# Patient Record
Sex: Female | Born: 1935 | Race: Black or African American | Hispanic: No | Marital: Married | Smoking: Never smoker
Health system: Southern US, Community
[De-identification: ages and names within clinical notes are randomized; demographics above are authoritative.]

## PROBLEM LIST (undated history)

## (undated) DIAGNOSIS — E119 Type 2 diabetes mellitus without complications: Secondary | ICD-10-CM

## (undated) DIAGNOSIS — N189 Chronic kidney disease, unspecified: Secondary | ICD-10-CM

## (undated) DIAGNOSIS — G459 Transient cerebral ischemic attack, unspecified: Secondary | ICD-10-CM

## (undated) DIAGNOSIS — I10 Essential (primary) hypertension: Secondary | ICD-10-CM

## (undated) DIAGNOSIS — F0150 Vascular dementia without behavioral disturbance: Secondary | ICD-10-CM

## (undated) DIAGNOSIS — E538 Deficiency of other specified B group vitamins: Secondary | ICD-10-CM

## (undated) DIAGNOSIS — E7800 Pure hypercholesterolemia, unspecified: Secondary | ICD-10-CM

## (undated) HISTORY — PX: OTHER SURGICAL HISTORY: SHX169

---

## 2014-11-15 ENCOUNTER — Observation Stay: Payer: Self-pay | Admitting: Internal Medicine

## 2014-11-15 LAB — COMPREHENSIVE METABOLIC PANEL
ALK PHOS: 102 U/L
ALT: 30 U/L
ANION GAP: 7 (ref 7–16)
Albumin: 3.3 g/dL — ABNORMAL LOW (ref 3.4–5.0)
BUN: 21 mg/dL — AB (ref 7–18)
Bilirubin,Total: 0.4 mg/dL (ref 0.2–1.0)
Calcium, Total: 8.6 mg/dL (ref 8.5–10.1)
Chloride: 107 mmol/L (ref 98–107)
Co2: 26 mmol/L (ref 21–32)
Creatinine: 1.19 mg/dL (ref 0.60–1.30)
GLUCOSE: 119 mg/dL — AB (ref 65–99)
POTASSIUM: 4.9 mmol/L (ref 3.5–5.1)
SGOT(AST): 40 U/L — ABNORMAL HIGH (ref 15–37)
Sodium: 140 mmol/L (ref 136–145)
TOTAL PROTEIN: 7.3 g/dL (ref 6.4–8.2)

## 2014-11-15 LAB — CBC WITH DIFFERENTIAL/PLATELET
BASOS PCT: 1.1 %
Basophil #: 0.1 10*3/uL (ref 0.0–0.1)
Eosinophil #: 0.1 10*3/uL (ref 0.0–0.7)
Eosinophil %: 1.4 %
HCT: 36 % (ref 35.0–47.0)
HGB: 11.6 g/dL — ABNORMAL LOW (ref 12.0–16.0)
LYMPHS PCT: 39.9 %
Lymphocyte #: 3.6 10*3/uL (ref 1.0–3.6)
MCH: 29.2 pg (ref 26.0–34.0)
MCHC: 32.1 g/dL (ref 32.0–36.0)
MCV: 91 fL (ref 80–100)
MONO ABS: 0.9 x10 3/mm (ref 0.2–0.9)
Monocyte %: 10.5 %
NEUTROS ABS: 4.2 10*3/uL (ref 1.4–6.5)
Neutrophil %: 47.1 %
Platelet: 233 10*3/uL (ref 150–440)
RBC: 3.96 10*6/uL (ref 3.80–5.20)
RDW: 15.4 % — AB (ref 11.5–14.5)
WBC: 9 10*3/uL (ref 3.6–11.0)

## 2014-11-15 LAB — URINALYSIS, COMPLETE
BACTERIA: NONE SEEN
Bilirubin,UR: NEGATIVE
Blood: NEGATIVE
Glucose,UR: NEGATIVE mg/dL (ref 0–75)
KETONE: NEGATIVE
Nitrite: NEGATIVE
PH: 6 (ref 4.5–8.0)
Protein: NEGATIVE
RBC,UR: <1 /HPF (ref 0–5)
Specific Gravity: 1.009 (ref 1.003–1.030)
WBC UR: 3 /HPF (ref 0–5)

## 2014-11-15 LAB — HEMOGLOBIN A1C: Hemoglobin A1C: 6.4 % — ABNORMAL HIGH (ref 4.2–6.3)

## 2014-11-15 LAB — TROPONIN I: Troponin-I: <0.02 ng/mL

## 2014-11-15 LAB — MAGNESIUM: Magnesium: 1.8 mg/dL

## 2014-11-15 LAB — TSH: THYROID STIMULATING HORM: 5.29 u[IU]/mL — AB

## 2014-11-16 DIAGNOSIS — I340 Nonrheumatic mitral (valve) insufficiency: Secondary | ICD-10-CM

## 2014-11-16 LAB — COMPREHENSIVE METABOLIC PANEL
ALBUMIN: 3.2 g/dL — AB (ref 3.4–5.0)
Alkaline Phosphatase: 88 U/L
Anion Gap: 6 — ABNORMAL LOW (ref 7–16)
BILIRUBIN TOTAL: 0.4 mg/dL (ref 0.2–1.0)
BUN: 23 mg/dL — ABNORMAL HIGH (ref 7–18)
CO2: 27 mmol/L (ref 21–32)
Calcium, Total: 8.4 mg/dL — ABNORMAL LOW (ref 8.5–10.1)
Chloride: 108 mmol/L — ABNORMAL HIGH (ref 98–107)
Creatinine: 1.35 mg/dL — ABNORMAL HIGH (ref 0.60–1.30)
EGFR (African American): 48 — ABNORMAL LOW
EGFR (Non-African Amer.): 40 — ABNORMAL LOW
Glucose: 80 mg/dL (ref 65–99)
Osmolality: 284 (ref 275–301)
POTASSIUM: 4.1 mmol/L (ref 3.5–5.1)
SGOT(AST): 27 U/L (ref 15–37)
SGPT (ALT): 25 U/L
Sodium: 141 mmol/L (ref 136–145)
Total Protein: 6.7 g/dL (ref 6.4–8.2)

## 2014-11-16 LAB — CBC WITH DIFFERENTIAL/PLATELET
BASOS PCT: 0.8 %
Basophil #: 0.1 10*3/uL (ref 0.0–0.1)
EOS ABS: 0.1 10*3/uL (ref 0.0–0.7)
EOS PCT: 1.3 %
HCT: 34.1 % — ABNORMAL LOW (ref 35.0–47.0)
HGB: 11.1 g/dL — ABNORMAL LOW (ref 12.0–16.0)
LYMPHS ABS: 2.8 10*3/uL (ref 1.0–3.6)
Lymphocyte %: 35.6 %
MCH: 29.7 pg (ref 26.0–34.0)
MCHC: 32.6 g/dL (ref 32.0–36.0)
MCV: 91 fL (ref 80–100)
Monocyte #: 0.9 x10 3/mm (ref 0.2–0.9)
Monocyte %: 11.5 %
NEUTROS PCT: 50.8 %
Neutrophil #: 4 10*3/uL (ref 1.4–6.5)
Platelet: 214 10*3/uL (ref 150–440)
RBC: 3.74 10*6/uL — ABNORMAL LOW (ref 3.80–5.20)
RDW: 15 % — AB (ref 11.5–14.5)
WBC: 8 10*3/uL (ref 3.6–11.0)

## 2014-11-16 LAB — LIPID PANEL
CHOLESTEROL: 227 mg/dL — AB (ref 0–200)
HDL: 60 mg/dL (ref 40–60)
LDL CHOLESTEROL, CALC: 144 mg/dL — AB (ref 0–100)
Triglycerides: 115 mg/dL (ref 0–200)
VLDL Cholesterol, Calc: 23 mg/dL (ref 5–40)

## 2014-11-16 LAB — TROPONIN I: Troponin-I: <0.02 ng/mL

## 2015-04-20 NOTE — H&P (Signed)
PATIENT NAME:  Theresa Santos, STEHR MR#:  [id number] DATE OF BIRTH:  05/15/1931  DATE OF ADMISSION:  11/15/2014  PRIMARY CARE PHYSICIAN: Marwan T. Powers, MD from [place].  HISTORY OF PRESENT ILLNESS: The patient is an 81 year old African American female with history of dementia, diabetes mellitus, questionable hypertension, who presents to the hospital with complaints of excessive aphasia. According to the patient's daughter as well as the patient herself, today at around 4:00 p.m. she started having difficulty speaking. She was moaning and trying to get her word out but was not able to do so. It lasted approximately 15 minutes and then went away. There was no difficulty with vision, swallowing, numbness or weakness in her upper or lower extremities. She presented to the Emergency Room for further evaluation and was noted to have mildly elevated blood pressure and hospitalist services were contacted for admission.   PAST MEDICAL HISTORY: Significant for history of dementia, diabetes, questionable hypertension.   MEDICATIONS: Unknown; possible donepezil, unknown doses.   PAST SURGICAL HISTORY: None except for cataracts, kidney biopsy for unknown kidney problems.  ALLERGIES: None.   FAMILY HISTORY: Hypertension in the patient's family. Stroke in the patient's mother who also had hypertension. The patient's father died at age of 87 of MI. The patient has 5 sisters, 1 sister had pancreatic cancer as well as breast cancer and the other sister had breast cancer.   SOCIAL HISTORY: The patient is widowed 3 years ago in December. She is living with her daughter since a few months ago. No smoking or alcohol abuse. She used to work in Product/process development scientist.  REVIEW OF SYSTEMS:  CONSTITUTIONAL: Positive for weight loss, approximately 10 pounds since arrival to [place] from [place]. Also feeling sweaty and warm after her episode of expressive aphasia. Cataracts are removed. She has history also of glaucoma.  Denies any fevers, chills, fatigue, weakness, pains or weight gain.  EYES: Denies any blurry vision, double vision.  EARS, NOSE AND THROAT: Denies any tinnitus, allergies, epistaxis, sinus pain, dentures, difficulty swallowing.  RESPIRATORY: Denies any wheezes, asthma, COPD.  CARDIOVASCULAR: Denies chest pains, orthopnea, arrhythmias, palpitations or syncope.  GASTROINTESTINAL: Denies any nausea, vomiting, diarrhea or constipation.  GENITOURINARY: Denies any dysuria, hematuria, frequency, incontinence. ENDOCRINOLOGY: Denies any polydipsia, nocturia, thyroid problems, heat or cold intolerance or thirst.  HEMATOLOGIC: Denies anemia, easy bruising or bleeding, swollen glands. SKIN: Denies any acne, rashes, lesions or change in moles.  MUSCULOSKELETAL: Denies arthritis, cramps, swelling.  NEUROLOGIC: Denies numbness, epilepsy or tremor.  PSYCHIATRIC: Denies anxiety, insomnia, depression.   PHYSICAL EXAMINATION:  VITAL SIGNS: On arrival to the hospital, temperature 98.2, pulse was 83, respiration rate 16, blood pressure 177/85, saturation was 97% on room air.  GENERAL: This is a well-developed, well-nourished, thin African American female in no significant distress, lying on the stretcher.  HEENT: Her pupils are equal, reactive to light. Extraocular movements intact. No icterus or conjunctivitis. Has normal hearing. No pharyngeal erythema. Mucosa is moist. NECK: No masses. Supple, nontender. Thyroid is not enlarged. No adenopathy. No JVD or carotid bruits bilaterally. Full range of motion.  LUNGS: Clear to auscultation in all fields. No rales, rhonchi, diminished breath sounds or wheezing. No labored inspiration, increased effort, dullness to percussion or overt respiratory distress.  CARDIOVASCULAR: S1, S2 appreciated. Rythm was regular. PMI not lateralized. Chest is nontender to palpation. Pedal pulses 1+. No lower extremity edema, calf tenderness or cyanosis was noted. ABDOMEN: Soft, nontender.  Bowel sounds are present. No hepatosplenomegaly or masses were noted.  RECTAL:  Deferred.  MUSCLE STRENGTH: Able to move all extremities, however, the patient does have significant difficulty with lower extremities since she has quite obvious cramps in her calf muscles each time she tries to lift her legs and keep them up from the bed. Otherwise, 5/5 in the upper extremities. No cyanosis, degenerative joint disease or kyphosis is noted. Gait not tested. SKIN: Did not reveal any rashes, lesions, erythema, nodularity or induration. It was warm and dry to palpation.  LYMPHATIC: No adenopathy in the cervical region.  NEUROLOGICAL: Cranial nerves grossly intact. Sensory is intact. The patient does have some expressive aphasia. She intermittently looks to her daughter to help her to remember things. She is alert and oriented to person and place. She is cooperative. Memory is impaired. PSYCHIATRIC: No significant confusion, agitation or depression was noted.   EKG: Showed normal sinus rhythm at 82 beats per minute, normal axis, voltage criteria for LVH with T depressions in high lateral leads, poor R wave progression in V3, no acute ST-T changes were noted.   LABORATORY DATA: Glucose 119, BUN of 21, otherwise BMP was unremarkable. The patient has albumin level of 3.3, AST elevated at 40. The patient's troponin was less than 0.02. White blood cell count is normal at 9.0, hemoglobin 11.6, platelet count 233,000. Absolute neutrophil count was 4.2 which is normal. Urinalysis was remarkable for 3 white blood cells, less than 1 red blood cell.   RADIOLOGIC STUDIES: CT scan of head without contrast showed chronic atrophic and ischemic changes without acute abnormality   ASSESSMENT AND PLAN:  1.  Transient ischemic attack with expressive aphasia. Admit the patient to the medical floor, telemetry, starting her on aspirin therapy as well as Lipitor. Get lipid panel in the morning.  Get carotid ultrasound as well as  echocardiogram.  2.  Diabetes mellitus. Get hemoglobin A1c and continue diabetic diet.  3.  Hypertension. Start the patient on lisinopril.  4.  Elevated transaminases. We will follow, unclear etiology at this point.  5.  Dementia. Continue donepezil at lower dose.   TIME SPENT: Fifty minutes.   ____________________________ Katharina Caperima Sande Pickert, MD rv:TT D: 11/15/2014 20:08:12 ET T: 11/15/2014 20:34:22 ET JOB#: [id number]  cc: Katharina Caperima Daryus Sowash, MD, <Dictator> Marwan T. Powers, MD Katharina CaperIMA Shadiyah Wernli MD ELECTRONICALLY SIGNED 12/20/2014 20:26

## 2015-04-20 NOTE — Discharge Summary (Signed)
Dates of Admission and Diagnosis:  Date of Admission 15-Nov-2014   Date of Discharge 16-Nov-2014   Admitting Diagnosis transient ischemic attack   Final Diagnosis transient ischemic attack hypertension hyperlipidemia Diabetes    Chief Complaint/History of Present Illness an 81 year old African American female with history of dementia, diabetes mellitus, questionable hypertension, who presents to the hospital with complaints of excessive aphasia. According to the patient's daughter as well as the patient herself, today at around 4:00 p.m. she started having difficulty speaking. She was moaning and trying to get her word out but was not able to do so. It lasted approximately 15 minutes and then went away. There was no difficulty with vision, swallowing, numbness or weakness in her upper or lower extremities. She presented to the Emergency Room for further evaluation and was noted to have mildly elevated blood pressure and hospitalist services were contacted for admission.   Allergies:  No Known Allergies:   Pertinent Past History:  Pertinent Past History dementia, diabetes, questionable hypertension.   Hospital Course:  Hospital Course 1.  Transient ischemic attack with expressive aphasia.   CT head, Carotid doppler and Echo are non contributory.    Functionally pt is back to her baseline, very active and walks without any support.    Lipids are slightly high.    Start ASA- she said 6 yrs ago she had soem problem due to statin in her kidney and they had to do kidney biopsy.     So will not start Statin- she can discuss with her PMD and decide. 2.  Diabetes mellitus. hemoglobin A1c- 6.1 and continue diabetic diet.  3.  Hypertension. not on any meds at home- will give lisinopril. 4.  Elevated transaminases. We will follow, unclear etiology at this point.  5.  Dementia. Continue donepezil at lower dose.   Condition on Discharge Stable   DISCHARGE INSTRUCTIONS HOME MEDS:  Medication  Reconciliation: Patient's Home Medications at Discharge:     Medication Instructions  lisinopril 5 mg oral tablet  1 tab(s) orally once a day   aspirin 81 mg oral delayed release tablet  1 tab(s) orally once a day   aricept 10 mg oral tablet  1 tab(s) orally once a day (at bedtime)   fortamet 500 mg oral tablet, extended release  1 tab(s) orally once a day     Physician's Instructions:  Diet Low Sodium  Low Fat, Low Cholesterol  Carbohydrate Controlled (ADA) Diet   Dietary Supplements Glucerna   Dietary Supplements Frequency Two times per day   Activity Limitations As tolerated   Return to Work Not Applicable   Time frame for Follow Up Appointment 2-4 weeks  PMD   Other Comments Pt is advised to discuss about cholesterol medication with PMD.   TIME SPENT:  Total Time: Greater than 30 minutes   Electronic Signatures: Altamese DillingVachhani, Sianni Cloninger (MD)  (Signed [phone]-Nov-15 18:42)  Authored: ADMISSION DATE AND DIAGNOSIS, CHIEF COMPLAINT/HPI, Allergies, PERTINENT PAST HISTORY, HOSPITAL COURSE, DISCHARGE INSTRUCTIONS HOME MEDS, PATIENT INSTRUCTIONS, TIME SPENT   Last Updated: 24-Nov-15 18:42 by Altamese DillingVachhani, Ajmal Kathan (MD)

## 2016-11-11 ENCOUNTER — Inpatient Hospital Stay
Admission: EM | Admit: 2016-11-11 | Discharge: 2016-11-14 | DRG: 175 | Disposition: A | Discharge status: Skilled Nursing Facility | Payer: Medicare Other | Attending: Internal Medicine | Admitting: Internal Medicine

## 2016-11-11 ENCOUNTER — Emergency Department: Payer: Medicare Other

## 2016-11-11 ENCOUNTER — Encounter: Payer: Self-pay | Admitting: Medical Oncology

## 2016-11-11 DIAGNOSIS — E7800 Pure hypercholesterolemia, unspecified: Secondary | ICD-10-CM | POA: Diagnosis present

## 2016-11-11 DIAGNOSIS — J9601 Acute respiratory failure with hypoxia: Secondary | ICD-10-CM | POA: Diagnosis present

## 2016-11-11 DIAGNOSIS — E1122 Type 2 diabetes mellitus with diabetic chronic kidney disease: Secondary | ICD-10-CM | POA: Diagnosis present

## 2016-11-11 DIAGNOSIS — I248 Other forms of acute ischemic heart disease: Secondary | ICD-10-CM | POA: Diagnosis present

## 2016-11-11 DIAGNOSIS — I340 Nonrheumatic mitral (valve) insufficiency: Secondary | ICD-10-CM | POA: Diagnosis present

## 2016-11-11 DIAGNOSIS — N183 Chronic kidney disease, stage 3 unspecified: Secondary | ICD-10-CM

## 2016-11-11 DIAGNOSIS — F0150 Vascular dementia without behavioral disturbance: Secondary | ICD-10-CM | POA: Diagnosis present

## 2016-11-11 DIAGNOSIS — M2500 Hemarthrosis, unspecified joint: Secondary | ICD-10-CM

## 2016-11-11 DIAGNOSIS — M1712 Unilateral primary osteoarthritis, left knee: Secondary | ICD-10-CM | POA: Diagnosis present

## 2016-11-11 DIAGNOSIS — M79604 Pain in right leg: Secondary | ICD-10-CM

## 2016-11-11 DIAGNOSIS — D72829 Elevated white blood cell count, unspecified: Secondary | ICD-10-CM

## 2016-11-11 DIAGNOSIS — R55 Syncope and collapse: Secondary | ICD-10-CM

## 2016-11-11 DIAGNOSIS — I2699 Other pulmonary embolism without acute cor pulmonale: Secondary | ICD-10-CM | POA: Diagnosis not present

## 2016-11-11 DIAGNOSIS — R7989 Other specified abnormal findings of blood chemistry: Secondary | ICD-10-CM

## 2016-11-11 DIAGNOSIS — I130 Hypertensive heart and chronic kidney disease with heart failure and stage 1 through stage 4 chronic kidney disease, or unspecified chronic kidney disease: Secondary | ICD-10-CM | POA: Diagnosis present

## 2016-11-11 DIAGNOSIS — M6281 Muscle weakness (generalized): Secondary | ICD-10-CM

## 2016-11-11 DIAGNOSIS — E538 Deficiency of other specified B group vitamins: Secondary | ICD-10-CM | POA: Diagnosis present

## 2016-11-11 DIAGNOSIS — M25062 Hemarthrosis, left knee: Secondary | ICD-10-CM | POA: Diagnosis present

## 2016-11-11 DIAGNOSIS — I5020 Unspecified systolic (congestive) heart failure: Secondary | ICD-10-CM | POA: Diagnosis present

## 2016-11-11 DIAGNOSIS — E876 Hypokalemia: Secondary | ICD-10-CM | POA: Diagnosis present

## 2016-11-11 DIAGNOSIS — E872 Acidosis, unspecified: Secondary | ICD-10-CM

## 2016-11-11 DIAGNOSIS — R778 Other specified abnormalities of plasma proteins: Secondary | ICD-10-CM

## 2016-11-11 DIAGNOSIS — M25562 Pain in left knee: Secondary | ICD-10-CM

## 2016-11-11 DIAGNOSIS — I420 Dilated cardiomyopathy: Secondary | ICD-10-CM | POA: Diagnosis present

## 2016-11-11 DIAGNOSIS — W19XXXA Unspecified fall, initial encounter: Secondary | ICD-10-CM

## 2016-11-11 DIAGNOSIS — D696 Thrombocytopenia, unspecified: Secondary | ICD-10-CM | POA: Diagnosis present

## 2016-11-11 DIAGNOSIS — R0902 Hypoxemia: Secondary | ICD-10-CM

## 2016-11-11 DIAGNOSIS — M25462 Effusion, left knee: Secondary | ICD-10-CM

## 2016-11-11 DIAGNOSIS — I429 Cardiomyopathy, unspecified: Secondary | ICD-10-CM

## 2016-11-11 DIAGNOSIS — R52 Pain, unspecified: Secondary | ICD-10-CM

## 2016-11-11 DIAGNOSIS — Z8673 Personal history of transient ischemic attack (TIA), and cerebral infarction without residual deficits: Secondary | ICD-10-CM | POA: Diagnosis not present

## 2016-11-11 HISTORY — DX: Deficiency of other specified B group vitamins: E53.8

## 2016-11-11 HISTORY — DX: Chronic kidney disease, unspecified: N18.9

## 2016-11-11 HISTORY — DX: Vascular dementia, unspecified severity, without behavioral disturbance, psychotic disturbance, mood disturbance, and anxiety: F01.50

## 2016-11-11 HISTORY — DX: Essential (primary) hypertension: I10

## 2016-11-11 HISTORY — DX: Pure hypercholesterolemia, unspecified: E78.00

## 2016-11-11 HISTORY — DX: Transient cerebral ischemic attack, unspecified: G45.9

## 2016-11-11 HISTORY — DX: Type 2 diabetes mellitus without complications: E11.9

## 2016-11-11 LAB — COMPREHENSIVE METABOLIC PANEL
ALK PHOS: 88 U/L (ref 38–126)
ALT: 29 U/L (ref 14–54)
ANION GAP: 12 (ref 5–15)
AST: 37 U/L (ref 15–41)
Albumin: 3.5 g/dL (ref 3.5–5.0)
BUN: 20 mg/dL (ref 6–20)
CALCIUM: 8.2 mg/dL — AB (ref 8.9–10.3)
CHLORIDE: 111 mmol/L (ref 101–111)
CO2: 17 mmol/L — AB (ref 22–32)
CREATININE: 1.36 mg/dL — AB (ref 0.44–1.00)
GFR, EST AFRICAN AMERICAN: 40 mL/min — AB (ref >60)
GFR, EST NON AFRICAN AMERICAN: 35 mL/min — AB (ref >60)
Glucose, Bld: 235 mg/dL — ABNORMAL HIGH (ref 65–99)
Potassium: 3.4 mmol/L — ABNORMAL LOW (ref 3.5–5.1)
SODIUM: 140 mmol/L (ref 135–145)
Total Bilirubin: 0.8 mg/dL (ref 0.3–1.2)
Total Protein: 6.4 g/dL — ABNORMAL LOW (ref 6.5–8.1)

## 2016-11-11 LAB — CBC
HCT: 36.7 % (ref 35.0–47.0)
HEMOGLOBIN: 12.1 g/dL (ref 12.0–16.0)
MCH: 29.9 pg (ref 26.0–34.0)
MCHC: 33.1 g/dL (ref 32.0–36.0)
MCV: 90.3 fL (ref 80.0–100.0)
PLATELETS: 145 10*3/uL — AB (ref 150–440)
RBC: 4.06 MIL/uL (ref 3.80–5.20)
RDW: 14.8 % — ABNORMAL HIGH (ref 11.5–14.5)
WBC: 18.5 10*3/uL — AB (ref 3.6–11.0)

## 2016-11-11 LAB — URINALYSIS COMPLETE WITH MICROSCOPIC (ARMC ONLY)
Bacteria, UA: NONE SEEN
Bilirubin Urine: NEGATIVE
Glucose, UA: >500 mg/dL — AB
HGB URINE DIPSTICK: NEGATIVE
LEUKOCYTES UA: NEGATIVE
Nitrite: NEGATIVE
PH: 6 (ref 5.0–8.0)
PROTEIN: NEGATIVE mg/dL
SPECIFIC GRAVITY, URINE: 1.016 (ref 1.005–1.030)
Squamous Epithelial / LPF: NONE SEEN

## 2016-11-11 LAB — PROTIME-INR
INR: 0.99
Prothrombin Time: 13.1 seconds (ref 11.4–15.2)

## 2016-11-11 LAB — APTT: APTT: 24 s (ref 24–36)

## 2016-11-11 LAB — TROPONIN I
TROPONIN I: 0.17 ng/mL — AB (ref <0.03)
Troponin I: 0.74 ng/mL (ref <0.03)
Troponin I: 1.2 ng/mL (ref <0.03)

## 2016-11-11 LAB — HEPARIN LEVEL (UNFRACTIONATED): Heparin Unfractionated: 1.44 IU/mL — ABNORMAL HIGH (ref 0.30–0.70)

## 2016-11-11 LAB — GLUCOSE, CAPILLARY
GLUCOSE-CAPILLARY: 128 mg/dL — AB (ref 65–99)
Glucose-Capillary: 157 mg/dL — ABNORMAL HIGH (ref 65–99)

## 2016-11-11 LAB — MRSA PCR SCREENING: MRSA BY PCR: NEGATIVE

## 2016-11-11 MED ORDER — HEPARIN (PORCINE) IN NACL 100-0.45 UNIT/ML-% IJ SOLN
850.0000 [IU]/h | INTRAMUSCULAR | Status: DC
  Administered 2016-11-12 (×2): 850 [IU]/h via INTRAVENOUS
  Filled 2016-11-11 (×2): qty 250

## 2016-11-11 MED ORDER — SODIUM CHLORIDE 0.9 % IV SOLN
Freq: Once | INTRAVENOUS | Status: AC
  Administered 2016-11-11: 10:00:00 via INTRAVENOUS

## 2016-11-11 MED ORDER — HEPARIN SODIUM (PORCINE) 5000 UNIT/ML IJ SOLN
4000.0000 [IU] | Freq: Once | INTRAMUSCULAR | Status: AC
Start: 2016-11-11 — End: 2016-11-11
  Administered 2016-11-11: 4000 [IU] via INTRAVENOUS
  Filled 2016-11-11: qty 1

## 2016-11-11 MED ORDER — SODIUM CHLORIDE 0.9 % IV SOLN: 250.0000 mL | INTRAVENOUS | Status: DC | PRN

## 2016-11-11 MED ORDER — ACETAMINOPHEN 650 MG RE SUPP: 650.0000 mg | Freq: Four times a day (QID) | RECTAL | Status: DC | PRN

## 2016-11-11 MED ORDER — LISINOPRIL 5 MG PO TABS
5.0000 mg | ORAL_TABLET | Freq: Every day | ORAL | Status: DC
  Administered 2016-11-11 – 2016-11-14 (×4): 5 mg via ORAL
  Filled 2016-11-11 (×4): qty 1

## 2016-11-11 MED ORDER — ADULT MULTIVITAMIN W/MINERALS CH
1.0000 | ORAL_TABLET | Freq: Every day | ORAL | Status: DC
  Administered 2016-11-11 – 2016-11-14 (×4): 1 via ORAL
  Filled 2016-11-11 (×4): qty 1

## 2016-11-11 MED ORDER — SODIUM CHLORIDE 0.9% FLUSH: 3.0000 mL | INTRAVENOUS | Status: DC | PRN

## 2016-11-11 MED ORDER — LATANOPROST 0.005 % OP SOLN
1.0000 [drp] | Freq: Every day | OPHTHALMIC | Status: DC
  Administered 2016-11-11 – 2016-11-13 (×3): 1 [drp] via OPHTHALMIC
  Filled 2016-11-11: qty 2.5

## 2016-11-11 MED ORDER — HEPARIN (PORCINE) IN NACL 100-0.45 UNIT/ML-% IJ SOLN
1100.0000 [IU]/h | INTRAMUSCULAR | Status: AC
  Administered 2016-11-11: 1100 [IU]/h via INTRAVENOUS
  Filled 2016-11-11 (×2): qty 250

## 2016-11-11 MED ORDER — INSULIN ASPART 100 UNIT/ML ~~LOC~~ SOLN
0.0000 [IU] | Freq: Three times a day (TID) | SUBCUTANEOUS | Status: DC
  Administered 2016-11-11: 2 [IU] via SUBCUTANEOUS
  Administered 2016-11-12: 1 [IU] via SUBCUTANEOUS
  Administered 2016-11-12: 2 [IU] via SUBCUTANEOUS
  Administered 2016-11-12: 1 [IU] via SUBCUTANEOUS
  Administered 2016-11-13: 2 [IU] via SUBCUTANEOUS
  Administered 2016-11-13 – 2016-11-14 (×4): 1 [IU] via SUBCUTANEOUS
  Filled 2016-11-11: qty 3
  Filled 2016-11-11 (×4): qty 1
  Filled 2016-11-11: qty 2
  Filled 2016-11-11: qty 1
  Filled 2016-11-11: qty 2
  Filled 2016-11-11: qty 1

## 2016-11-11 MED ORDER — ACETAMINOPHEN 325 MG PO TABS
650.0000 mg | ORAL_TABLET | Freq: Four times a day (QID) | ORAL | Status: DC | PRN
Start: 2016-11-11 — End: 2016-11-14
  Administered 2016-11-12: 650 mg via ORAL
  Filled 2016-11-11: qty 2

## 2016-11-11 MED ORDER — ONDANSETRON HCL 4 MG/2ML IJ SOLN
4.0000 mg | Freq: Once | INTRAMUSCULAR | Status: AC
  Administered 2016-11-11: 4 mg via INTRAVENOUS
  Filled 2016-11-11: qty 2

## 2016-11-11 MED ORDER — SODIUM CHLORIDE 0.9% FLUSH
3.0000 mL | Freq: Two times a day (BID) | INTRAVENOUS | Status: DC
  Administered 2016-11-11 – 2016-11-14 (×3): 3 mL via INTRAVENOUS

## 2016-11-11 MED ORDER — IOPAMIDOL (ISOVUE-370) INJECTION 76%
60.0000 mL | Freq: Once | INTRAVENOUS | Status: AC | PRN
  Administered 2016-11-11: 60 mL via INTRAVENOUS

## 2016-11-11 MED ORDER — INSULIN ASPART 100 UNIT/ML ~~LOC~~ SOLN: 0.0000 [IU] | Freq: Every day | SUBCUTANEOUS | Status: DC

## 2016-11-11 MED ORDER — DORZOLAMIDE HCL-TIMOLOL MAL 2-0.5 % OP SOLN
1.0000 [drp] | Freq: Two times a day (BID) | OPHTHALMIC | Status: DC
  Administered 2016-11-11 – 2016-11-14 (×7): 1 [drp] via OPHTHALMIC
  Filled 2016-11-11: qty 10

## 2016-11-11 MED ORDER — HEPARIN (PORCINE) IN NACL 100-0.45 UNIT/ML-% IJ SOLN: 12.0000 [IU]/kg/h | Freq: Once | INTRAMUSCULAR | Status: DC

## 2016-11-11 MED ORDER — VITAMIN B-12 1000 MCG PO TABS
1000.0000 ug | ORAL_TABLET | Freq: Every day | ORAL | Status: DC
  Administered 2016-11-11 – 2016-11-14 (×4): 1000 ug via ORAL
  Filled 2016-11-11 (×4): qty 1

## 2016-11-11 MED ORDER — ASPIRIN EC 81 MG PO TBEC
81.0000 mg | DELAYED_RELEASE_TABLET | Freq: Every day | ORAL | Status: DC
  Administered 2016-11-11 – 2016-11-14 (×4): 81 mg via ORAL
  Filled 2016-11-11 (×4): qty 1

## 2016-11-11 MED ORDER — ATORVASTATIN CALCIUM 10 MG PO TABS
10.0000 mg | ORAL_TABLET | Freq: Every day | ORAL | Status: DC
  Administered 2016-11-12 – 2016-11-14 (×3): 10 mg via ORAL
  Filled 2016-11-11 (×3): qty 1

## 2016-11-11 MED ORDER — LORAZEPAM 0.5 MG PO TABS: 0.5000 mg | ORAL_TABLET | Freq: Every day | ORAL | Status: DC | PRN

## 2016-11-11 NOTE — ED Provider Notes (Signed)
[facility] Emergency Department Provider Note      Level V caveat: Review of systems and history is limited by altered mental status  Time seen: ----------------------------------------- 8:46 AM on 11/11/2016 -----------------------------------------    I have reviewed the triage vital signs and the nursing notes.   HISTORY  Chief Complaint No chief complaint on file.    HPI Theresa Santos is a 82 y.o. female who presents to the ER being brought by ambulance from a nursing home for syncope. Patient states she wasn't feeling well when she got this morning, she subsequently had several vomiting episodes and then passed out. She was found unresponsive by staff. Patient reports feeling like she needs to have a bowel movement currently. She states she has not had diarrhea.Patient was also found to be hypoxic on arrival and placed on nasal cannula oxygen   No past medical history on file.  There are no active problems to display for this patient.   No past surgical history on file.  Allergies Patient has no allergy information on record.  Social History Social History  Substance Use Topics  . Smoking status: Not on file  . Smokeless tobacco: Not on file  . Alcohol use Not on file    Review of Systems Constitutional: Negative for fever. Cardiovascular: Negative for chest pain. Respiratory: Negative for shortness of breath. Gastrointestinal: Negative for abdominal pain, Positive for vomiting Skin: Negative for rash. Neurological: Negative for headaches, positive for weakness  10-point ROS otherwise negative.  ____________________________________________   PHYSICAL EXAM:  VITAL SIGNS: ED Triage Vitals  Enc Vitals Group     BP      Pulse      Resp      Temp      Temp src      SpO2      Weight      Height      Head Circumference      Peak Flow      Pain Score      Pain Loc      Pain Edu?      Excl. in GC?     Constitutional:  Lethargic, no distress Eyes: Conjunctivae are normal. PERRL. Normal extraocular movements. ENT   Head: Normocephalic and atraumatic.   Nose: No congestion/rhinnorhea.   Mouth/Throat: Mucous membranes are moist.   Neck: No stridor. Cardiovascular: Normal rate, regular rhythm. No murmurs, rubs, or gallops. Respiratory: Normal respiratory effort without tachypnea nor retractions. Breath sounds are clear and equal bilaterally. No wheezes/rales/rhonchi. Gastrointestinal: Soft and nontender. Normal bowel sounds Musculoskeletal: Nontender with normal range of motion in all extremities. No lower extremity tenderness nor edema. Neurologic:  Normal speech and language. No gross focal neurologic deficits are appreciated.  Skin:  Skin is warm, dry and intact. No rash noted. Psychiatric: Mood and affect are normal. Speech and behavior are normal.  ____________________________________________  EKG: Interpreted by me. Sinus tachycardia with a rate of 103 bpm, normal PR interval, normal QRS, long QT,  ____________________________________________  ED COURSE:  Pertinent labs & imaging results that were available during my care of the patient were reviewed by me and considered in my medical decision making (see chart for details). Clinical Course   Patient presents the ER after syncopal event, likely vagal in nature. She is also hypoxic of uncertain etiology. We will assess with labs and imaging.  Procedures ____________________________________________   LABS (pertinent positives/negatives)  Labs Reviewed  TROPONIN I - Abnormal; Notable for the following:  Result Value   Troponin I 0.17 (*)    All other components within normal limits  COMPREHENSIVE METABOLIC PANEL - Abnormal; Notable for the following:    Potassium 3.4 (*)    CO2 17 (*)    Glucose, Bld 235 (*)    Creatinine, Ser 1.36 (*)    Calcium 8.2 (*)    Total Protein 6.4 (*)    GFR calc non Af Amer 35 (*)    GFR calc  Af Amer 40 (*)    All other components within normal limits  CBC - Abnormal; Notable for the following:    WBC 18.5 (*)    RDW 14.8 (*)    Platelets 145 (*)    All other components within normal limits  CBC  URINALYSIS COMPLETEWITH MICROSCOPIC ([facility] ONLY)  APTT  PROTIME-INR   CRITICAL CARE Performed by: Emily FilbertWilliams, Jonathan E   Total critical care time: 30 minutes  Critical care time was exclusive of separately billable procedures and treating other patients.  Critical care was necessary to treat or prevent imminent or life-threatening deterioration.  Critical care was time spent personally by me on the following activities: development of treatment plan with patient and/or surrogate as well as nursing, discussions with consultants, evaluation of patient's response to treatment, examination of patient, obtaining history from patient or surrogate, ordering and performing treatments and interventions, ordering and review of laboratory studies, ordering and review of radiographic studies, pulse oximetry and re-evaluation of patient's condition.  RADIOLOGY Images were viewed by me  CT angiogram of the chest IMPRESSION: 1. Bilateral pulmonary emboli more extensive on the right. Positive for acute PE with CTevidence of right heart strain (RV/LV Ratio = 1.04) consistent with at least submassive (intermediate risk) PE. The presence of right heart strain has been associated with an increased risk of morbidity and mortality. Critical Value/emergent results were called by telephone at the time of interpretation on 11/11/2016 at 10:29 am to Dr. Daryel NovemberJONATHAN WILLIAMS , who verbally acknowledged these results. ____________________________________________  FINAL ASSESSMENT AND PLAN  Syncope, hypoxia, pulmonary emboli  Plan: Patient with labs and imaging as dictated above. Patient presented to the ER with syncope and vomiting with hypoxia. She was subsequently found to have pulmonary emboli,  worse on the right. We have placed her on heparin. Troponin is elevated secondary to heart strain. I advised the family mortality is likely 20% for this type of scenario. I have discussed with the hospitalist for admission.   Emily FilbertWilliams, Jonathan E, MD   Note: This dictation was prepared with Dragon dictation. Any transcriptional errors that result from this process are unintentional    Emily FilbertJonathan E Williams, MD 11/11/16 1038

## 2016-11-11 NOTE — Progress Notes (Signed)
ANTICOAGULATION CONSULT NOTE - Initial Consult  Pharmacy Consult for Heparin Drip Indication: pulmonary embolus  No Known Allergies  Patient Measurements: Height: 5\' 6"  (167.6 cm) Weight: 160 lb (72.6 kg) IBW/kg (Calculated) : 59.3 Heparin Dosing Weight: 72.6 kg  Vital Signs: Temp: 96.8 F (36 C) (11/15 0915) Temp Source: Rectal (11/15 0915) BP: 156/96 (11/15 0930) Pulse Rate: 104 (11/15 0850)  Labs:  Recent Labs  11/11/16 0851 11/11/16 0926  HGB  --  12.1  HCT  --  36.7  PLT  --  145*  CREATININE 1.36*  --   TROPONINI 0.17*  --     Estimated Creatinine Clearance: 32 mL/min (by C-G formula based on SCr of 1.36 mg/dL (H)).   Medical History: Past Medical History:  Diagnosis Date  . Chronic kidney disease   . Diabetes mellitus without complication (HCC)   . High cholesterol   . Hypertension   . TIA (transient ischemic attack)   . Vascular dementia     Medications:  Infusions:  . heparin      Assessment: 82 yo female from nursing home after vomiting and found unresponsive by staff.  CT angiogram positive for acute PE Goal of Therapy:  Heparin level 0.3-0.7 units/ml Monitor platelets by anticoagulation protocol: Yes   Plan:  Give 4000 units bolus x 1 Start heparin infusion at 1100 units/hr Check anti-Xa level in 8 hours and daily while on heparin Continue to monitor H&H and platelets  Yulia Ulrich K 11/11/2016,10:43 AM

## 2016-11-11 NOTE — Progress Notes (Signed)
ANTICOAGULATION CONSULT NOTE - Initial Consult  Pharmacy Consult for Heparin Drip Indication: pulmonary embolus  No Known Allergies  Patient Measurements: Height: 5\' 6"  (167.6 cm) Weight: 160 lb (72.6 kg) IBW/kg (Calculated) : 59.3 Heparin Dosing Weight: 72.6 kg  Vital Signs: Temp: 98.6 F (37 C) (11/15 2030) Temp Source: Oral (11/15 2030) BP: 128/69 (11/15 2030) Pulse Rate: 98 (11/15 2030)  Labs:  Recent Labs  11/11/16 0851 11/11/16 0926 11/11/16 1239 11/11/16 1934  HGB  --  12.1  --   --   HCT  --  36.7  --   --   PLT  --  145*  --   --   APTT 24  --   --   --   LABPROT 13.1  --   --   --   INR 0.99  --   --   --   HEPARINUNFRC  --   --   --  1.44*  CREATININE 1.36*  --   --   --   TROPONINI 0.17*  --  0.74* 1.20*    Estimated Creatinine Clearance: 32 mL/min (by C-G formula based on SCr of 1.36 mg/dL (H)).   Medical History: Past Medical History:  Diagnosis Date  . Chronic kidney disease   . Diabetes mellitus without complication (HCC)   . High cholesterol   . Hypertension   . TIA (transient ischemic attack)   . Vascular dementia     Medications:  Infusions:  . heparin 1,100 Units/hr (11/11/16 1124)  . heparin      Assessment: 82 yo female from nursing home after vomiting and found unresponsive by staff.  CT angiogram positive for acute PE Goal of Therapy:  Heparin level 0.3-0.7 units/ml Monitor platelets by anticoagulation protocol: Yes   Plan:  Give 4000 units bolus x 1 Start heparin infusion at 1100 units/hr Check anti-Xa level in 8 hours and daily while on heparin Continue to monitor H&H and platelets   11/15:  HL @ 19:30 = 1.44  Will hold heparin drip for 1 hr and restart heparin gtt @ 22:30 at 900 units/hr. Will recheck HL on 11/16 with 0630.   Ciarah Peace D 11/11/2016,9:20 PM

## 2016-11-11 NOTE — ED Notes (Signed)
Pt placed on bedpan

## 2016-11-11 NOTE — ED Triage Notes (Signed)
Pt from [facility] via ems. Staff there reported that pt got up this am and ate breakfast and after breakfast she vomited x 2. Pt began to vomit again when she slumped over and had an approx 30 sec syncopal episode. Staff lowered pt to the floor. When ems arrived pt was pale and sats were 80% on RA. Pt was placed on 4L [place] with ems and sats only got up to 90%. Pt denies pain upon arrival only reports that she needs to have BM. Pt is A/O.

## 2016-11-11 NOTE — H&P (Addendum)
Theresa Santos is an 82 y.o. female.   Chief Complaint: Passing out HPI: This is a 82 year old female who lives at [facility] assisted [facility]. She has a history of dementia with memory loss. She was showering today and passed out. EMS was called. Here in the ER she is found to be hypoxic and have bilateral PEs. She nor her family recall any preceding symptoms. She had some bilateral ankle swelling recently but not any calf tenderness or Swelling. She is fairly sedentary.  Past Medical History:  Diagnosis Date  . Chronic kidney disease   . Diabetes mellitus without complication ([place])   . High cholesterol   . Hypertension   . TIA (transient ischemic attack)   . Vascular dementia     No past surgical history on file.  No family history on file. Social History:  has no tobacco, alcohol, and drug history on file.  Allergies: No Known Allergies   (Not in a hospital admission)  Results for orders placed or performed during the hospital encounter of 11/11/16 (from the past 48 hour(s))  Troponin I     Status: Abnormal   Collection Time: 11/11/16  8:51 AM  Result Value Ref Range   Troponin I 0.17 (HH) <0.03 ng/mL    Comment: CRITICAL RESULT CALLED TO, READ BACK BY AND VERIFIED WITH HEATHER FISHER 11/11/16 0932 KLW   Comprehensive metabolic panel     Status: Abnormal   Collection Time: 11/11/16  8:51 AM  Result Value Ref Range   Sodium 140 135 - 145 mmol/L   Potassium 3.4 (L) 3.5 - 5.1 mmol/L   Chloride 111 101 - 111 mmol/L   CO2 17 (L) 22 - 32 mmol/L   Glucose, Bld 235 (H) 65 - 99 mg/dL   BUN 20 6 - 20 mg/dL   Creatinine, Ser 1.36 (H) 0.44 - 1.00 mg/dL   Calcium 8.2 (L) 8.9 - 10.3 mg/dL   Total Protein 6.4 (L) 6.5 - 8.1 g/dL   Albumin 3.5 3.5 - 5.0 g/dL   AST 37 15 - 41 U/L   ALT 29 14 - 54 U/L   Alkaline Phosphatase 88 38 - 126 U/L   Total Bilirubin 0.8 0.3 - 1.2 mg/dL   GFR calc non Af Amer 35 (L) >60 mL/min   GFR calc Af Amer 40 (L) >60 mL/min    Comment: (NOTE) The  eGFR has been calculated using the CKD EPI equation. This calculation has not been validated in all clinical situations. eGFR's persistently <60 mL/min signify possible Chronic Kidney Disease.    Anion gap 12 5 - 15  APTT     Status: None   Collection Time: 11/11/16  8:51 AM  Result Value Ref Range   aPTT 24 24 - 36 seconds  Protime-INR     Status: None   Collection Time: 11/11/16  8:51 AM  Result Value Ref Range   Prothrombin Time 13.1 11.4 - 15.2 seconds   INR 0.99   CBC     Status: Abnormal   Collection Time: 11/11/16  9:26 AM  Result Value Ref Range   WBC 18.5 (H) 3.6 - 11.0 K/uL   RBC 4.06 3.80 - 5.20 MIL/uL   Hemoglobin 12.1 12.0 - 16.0 g/dL   HCT 36.7 35.0 - 47.0 %   MCV 90.3 80.0 - 100.0 fL   MCH 29.9 26.0 - 34.0 pg   MCHC 33.1 32.0 - 36.0 g/dL   RDW 14.8 (H) 11.5 - 14.5 %   Platelets 145 (L)  150 - 440 K/uL  Urinalysis complete, with microscopic ([facility] only)     Status: Abnormal   Collection Time: 11/11/16 10:30 AM  Result Value Ref Range   Color, Urine YELLOW (A) YELLOW   APPearance CLEAR (A) CLEAR   Glucose, UA >500 (A) NEGATIVE mg/dL   Bilirubin Urine NEGATIVE NEGATIVE   Ketones, ur 1+ (A) NEGATIVE mg/dL   Specific Gravity, Urine 1.016 1.005 - 1.030   Hgb urine dipstick NEGATIVE NEGATIVE   pH 6.0 5.0 - 8.0   Protein, ur NEGATIVE NEGATIVE mg/dL   Nitrite NEGATIVE NEGATIVE   Leukocytes, UA NEGATIVE NEGATIVE   RBC / HPF 0-5 0 - 5 RBC/hpf   WBC, UA 0-5 0 - 5 WBC/hpf   Bacteria, UA NONE SEEN NONE SEEN   Squamous Epithelial / LPF NONE SEEN NONE SEEN   Ct Angio Chest Pe W And/or Wo Contrast  Result Date: 11/11/2016 CLINICAL DATA:  Dyspnea, hypoxia, syncope EXAM: CT ANGIOGRAPHY CHEST WITH CONTRAST TECHNIQUE: Multidetector CT imaging of the chest was performed using the standard protocol during bolus administration of intravenous contrast. Multiplanar CT image reconstructions and MIPs were obtained to evaluate the vascular anatomy. CONTRAST:  60 mL Isovue 370  COMPARISON:  None. FINDINGS: Cardiovascular: Satisfactory opacification of the pulmonary arteries to the segmental level. Pulmonary embolus in the right main pulmonary artery. Extensive pulmonary embolus in the lobar and segmental branches of the right upper lobe, right middle lobe and right lower lobe. Pulmonary embolus in the segmental branches of the left lower lobe. Normal heart size. No pericardial effusion. Normal caliber thoracic aorta. Thoracic aortic atherosclerosis. Mediastinum/Nodes: No enlarged mediastinal, hilar, or axillary lymph nodes. Thyroid gland, trachea, and esophagus demonstrate no significant findings. Lungs/Pleura: Lungs are clear. No pleural effusion or pneumothorax. Upper Abdomen: No acute abnormality. Musculoskeletal: No chest wall abnormality. No acute or significant osseous findings. Review of the MIP images confirms the above findings. IMPRESSION: 1. Bilateral pulmonary emboli more extensive on the right. Positive for acute PE with CTevidence of right heart strain (RV/LV Ratio = 1.04) consistent with at least submassive (intermediate risk) PE. The presence of right heart strain has been associated with an increased risk of morbidity and mortality. Critical Value/emergent results were called by telephone at the time of interpretation on 11/11/2016 at 10:29 am to Dr. Lenise Arena , who verbally acknowledged these results. Electronically Signed   By: Kathreen Devoid   On: 11/11/2016 10:33    Review of Systems  Constitutional: Negative for chills and fever.  HENT: Negative for hearing loss.   Eyes: Negative for blurred vision.  Respiratory: Negative for shortness of breath.   Cardiovascular: Negative for chest pain.  Gastrointestinal: Negative for nausea and vomiting.  Genitourinary: Negative for dysuria.  Musculoskeletal: Negative for back pain.  Skin: Negative for rash.  Neurological: Negative for sensory change.  Psychiatric/Behavioral: Positive for memory loss.     Blood pressure (!) 156/96, pulse (!) 104, temperature (!) 96.8 F (36 C), temperature source Rectal, resp. rate (!) 22, height 5' 6"  (1.676 m), weight 72.6 kg (160 lb), SpO2 94 %. Physical Exam  Constitutional: She appears well-developed and well-nourished. No distress.  HENT:  Head: Normocephalic and atraumatic.  Mouth/Throat: Oropharynx is clear and moist. No oropharyngeal exudate.  Eyes: Pupils are equal, round, and reactive to light. No scleral icterus.  Neck: No JVD present. No tracheal deviation present. No thyromegaly present.  Cardiovascular:  Regular rate and rhythm. 2/6 systolic murmur  Respiratory:  Clear to auscultation. No dullness to  percussion. No use of accessory muscles.  GI: Soft. Bowel sounds are normal. She exhibits no mass. There is no tenderness.  Musculoskeletal:  Small amount of ankle edema bilateral. No calf tenderness or swelling in the cast.  Lymphadenopathy:    She has no cervical adenopathy.  Neurological: She is alert.  Interacts but does have memory loss. Moves all extremities. Responds to commands.  Skin: Skin is warm and dry.     Assessment/Plan 1. Acute respiratory failure. Oxygen saturation was 80. Likely secondary from the bilateral PE. [place], with nasal cannula. We'll continue oxygen support as needed.  2. Bilateral pulmonary emboli. She has extensive clot on the right side more than left CT scan did show some evidence of right heart strain in her troponin is elevated. Will go ahead and admit her start her on IV heparin and supportive care. On exam there is no calf tenderness or swelling so doubt there is any residual clot left in her legs. At this point there is no evidence to show that she cannot tolerate anticoagulation so IVC filter would not be warranted at this point.  3. Elevated troponin. There is no acute change on EKG. Suspect this is from her right heart strain from pulmonary emboli. We'll treat underlying calls and defer any cardiac  workup at this time unless troponin elevates further.  4. Diabetes. This is been diet-controlled in the past however sugars are over 200 now. So will add sliding scale insulin for now.  5. Stage III chronic renal failure. Her creatinine appears to be at baseline.  6. Hypertension. Will continue current medications.  7. CODE STATUS. Discussed with her daughter who is the POA and she wishes her to be a full code.  8. Syncope. Most likely from the pulmonary emboli. No further workup at this time.  Time spent 50 minutes  Baxter Hire, MD 11/11/2016, 11:13 AM

## 2016-11-12 ENCOUNTER — Inpatient Hospital Stay: Payer: Medicare Other

## 2016-11-12 ENCOUNTER — Inpatient Hospital Stay
Admit: 2016-11-12 | Discharge: 2016-11-12 | Disposition: A | Discharge status: Home / Self Care | Payer: Medicare Other | Attending: Internal Medicine | Admitting: Internal Medicine

## 2016-11-12 LAB — BASIC METABOLIC PANEL
ANION GAP: 8 (ref 5–15)
BUN: 29 mg/dL — AB (ref 6–20)
CALCIUM: 8.5 mg/dL — AB (ref 8.9–10.3)
CO2: 24 mmol/L (ref 22–32)
Chloride: 108 mmol/L (ref 101–111)
Creatinine, Ser: 1.64 mg/dL — ABNORMAL HIGH (ref 0.44–1.00)
GFR calc Af Amer: 32 mL/min — ABNORMAL LOW (ref >60)
GFR, EST NON AFRICAN AMERICAN: 28 mL/min — AB (ref >60)
GLUCOSE: 151 mg/dL — AB (ref 65–99)
Potassium: 3.9 mmol/L (ref 3.5–5.1)
SODIUM: 140 mmol/L (ref 135–145)

## 2016-11-12 LAB — CBC
HCT: 33.7 % — ABNORMAL LOW (ref 35.0–47.0)
HEMOGLOBIN: 11.2 g/dL — AB (ref 12.0–16.0)
MCH: 29.6 pg (ref 26.0–34.0)
MCHC: 33.3 g/dL (ref 32.0–36.0)
MCV: 88.9 fL (ref 80.0–100.0)
PLATELETS: 141 10*3/uL — AB (ref 150–440)
RBC: 3.8 MIL/uL (ref 3.80–5.20)
RDW: 14.7 % — ABNORMAL HIGH (ref 11.5–14.5)
WBC: 15.8 10*3/uL — AB (ref 3.6–11.0)

## 2016-11-12 LAB — HEPARIN LEVEL (UNFRACTIONATED)
HEPARIN UNFRACTIONATED: 0.71 [IU]/mL — AB (ref 0.30–0.70)
Heparin Unfractionated: 1.11 IU/mL — ABNORMAL HIGH (ref 0.30–0.70)

## 2016-11-12 LAB — GLUCOSE, CAPILLARY
GLUCOSE-CAPILLARY: 146 mg/dL — AB (ref 65–99)
GLUCOSE-CAPILLARY: 171 mg/dL — AB (ref 65–99)
GLUCOSE-CAPILLARY: 167 mg/dL — AB (ref 65–99)
Glucose-Capillary: 122 mg/dL — ABNORMAL HIGH (ref 65–99)

## 2016-11-12 LAB — TROPONIN I: TROPONIN I: 0.92 ng/mL — AB (ref <0.03)

## 2016-11-12 MED ORDER — HYDROCODONE-ACETAMINOPHEN 5-325 MG PO TABS: 1.0000 | ORAL_TABLET | ORAL | Status: DC | PRN

## 2016-11-12 MED ORDER — HEPARIN (PORCINE) IN NACL 100-0.45 UNIT/ML-% IJ SOLN
700.0000 [IU]/h | INTRAMUSCULAR | Status: AC
  Administered 2016-11-13: 700 [IU]/h via INTRAVENOUS
  Filled 2016-11-12: qty 250

## 2016-11-12 NOTE — Progress Notes (Signed)
Pt has not been able to void on her own today. Had to have I and O cath earlier today. Bladder scan is showing 700 in bladder. Spoke with Dr. Arlyss RepressWillis Ok to place Foley catheter.

## 2016-11-12 NOTE — Progress Notes (Signed)
PT Cancellation Note  Patient Details Name: Jannet Askewverlean Ridling MRN: [id number] DOB: 05/15/1931   Cancelled Treatment:    Reason Eval/Treat Not Completed: Other (comment). Consult received and chart reviewed. Pt admitted yesterday with B acute PE. Started on heparin at 1124. Per policy, will hold for 48 hours prior to initiation of physical therapy. Will attempt evaluation on 11/17 in PM.   Kharisma Glasner 11/12/2016, 11:17 AM  Elizabeth PalauStephanie Marrell Dicaprio, PT, DPT [phone]

## 2016-11-12 NOTE — Progress Notes (Signed)
ANTICOAGULATION CONSULT NOTE - Initial Consult  Pharmacy Consult for Heparin Drip Indication: pulmonary embolus  No Known Allergies  Patient Measurements: Height: 5\' 6"  (167.6 cm) Weight: 160 lb (72.6 kg) IBW/kg (Calculated) : 59.3 Heparin Dosing Weight: 72.6 kg  Vital Signs: Temp: 98.9 F (37.2 C) (11/16 0453) Temp Source: Oral (11/16 0453) BP: 130/78 (11/16 0453) Pulse Rate: 93 (11/16 0453)  Labs:  Recent Labs  11/11/16 0851 11/11/16 0926 11/11/16 1239 11/11/16 1934 11/12/16 0030 11/12/16 0448  HGB  --  12.1  --   --   --   --   HCT  --  36.7  --   --   --   --   PLT  --  145*  --   --   --   --   APTT 24  --   --   --   --   --   LABPROT 13.1  --   --   --   --   --   INR 0.99  --   --   --   --   --   HEPARINUNFRC  --   --   --  1.44*  --  0.71*  CREATININE 1.36*  --   --   --   --  1.64*  TROPONINI 0.17*  --  0.74* 1.20* 0.92*  --     Estimated Creatinine Clearance: 26.5 mL/min (by C-G formula based on SCr of 1.64 mg/dL (H)).   Medical History: Past Medical History:  Diagnosis Date  . Chronic kidney disease   . Diabetes mellitus without complication (HCC)   . High cholesterol   . Hypertension   . TIA (transient ischemic attack)   . Vascular dementia     Medications:  Infusions:  . heparin 900 Units/hr (11/11/16 2241)    Assessment: 82 yo female from nursing home after vomiting and found unresponsive by staff.  CT angiogram positive for acute PE Goal of Therapy:  Heparin level 0.3-0.7 units/ml Monitor platelets by anticoagulation protocol: Yes   Plan:  Give 4000 units bolus x 1 Start heparin infusion at 1100 units/hr Check anti-Xa level in 8 hours and daily while on heparin Continue to monitor H&H and platelets   11/15:  HL @ 19:30 = 1.44  Will hold heparin drip for 1 hr and restart heparin gtt @ 22:30 at 900 units/hr. Will recheck HL on 11/16 with 0630.    11/16 AM heparin level 0.71. Decrease to 850 units/hr and recheck in 8  hours.  Marquite Attwood S 11/12/2016,6:18 AM

## 2016-11-12 NOTE — Progress Notes (Signed)
[facility] [place] [facility]   PATIENT NAME: Theresa Santos    MR#:  [id number]  DATE OF BIRTH:  05/15/1931  SUBJECTIVE:  CHIEF COMPLAINT:   Chief Complaint  Patient presents with  . Loss of Consciousness  . Nausea  . Emesis  The patient is 82 year old female with past medical history significant for history of dementia,, CK D, diabetes mellitus, hyperlipidemia, hypertension, TIA, who presents to the hospital with complaints of syncopal episode of. On arrival to the hospital patient was hypoxic with O2 sats in 80s, CT angiogram was performed which revealed bilateral pulmonary embolism. Ultrasound of lower extremity showed no DVT. Patient complained of no significant discomfort in the morning, however, upon further discussion with patient's daughter that there that she was having some pain in the right leg.  Review of Systems  Unable to perform ROS: Dementia    VITAL SIGNS: Blood pressure (!) 149/82, pulse 100, temperature 98.2 F (36.8 C), temperature source Oral, resp. rate 18, height 5\' 6"  (1.676 m), weight 72.6 kg (160 lb), SpO2 95 %.  PHYSICAL EXAMINATION:   GENERAL:  82 y.o.-year-old patient lying in the bed with no acute distress.  EYES: Pupils equal, round, reactive to light and accommodation. No scleral icterus. Extraocular muscles intact.  HEENT: Head atraumatic, normocephalic. Oropharynx and nasopharynx clear.  NECK:  Supple, no jugular venous distention. No thyroid enlargement, no tenderness.  LUNGS: Normal breath sounds bilaterally, no wheezing, rales,rhonchi or crepitation. No use of accessory muscles of respiration.  CARDIOVASCULAR: S1, S2 normal. No murmurs, rubs, or gallops.  ABDOMEN: Soft, nontender, nondistended. Bowel sounds present. No organomegaly or mass.  EXTREMITIES: No pedal edema, cyanosis, or clubbing.  NEUROLOGIC: Cranial nerves II through XII are intact. Muscle strength 5/5 in all extremities. Sensation intact. Gait not  checked.  PSYCHIATRIC: The patient is alert and oriented x 3.  SKIN: No obvious rash, lesion, or ulcer.   ORDERS/RESULTS REVIEWED:   CBC  Recent Labs Lab 11/11/16 0926  WBC 18.5*  HGB 12.1  HCT 36.7  PLT 145*  MCV 90.3  MCH 29.9  MCHC 33.1  RDW 14.8*   ------------------------------------------------------------------------------------------------------------------  Chemistries   Recent Labs Lab 11/11/16 0851 11/12/16 0448  NA 140 140  K 3.4* 3.9  CL 111 108  CO2 17* 24  GLUCOSE 235* 151*  BUN 20 29*  CREATININE 1.36* 1.64*  CALCIUM 8.2* 8.5*  AST 37  --   ALT 29  --   ALKPHOS 88  --   BILITOT 0.8  --    ------------------------------------------------------------------------------------------------------------------ estimated creatinine clearance is 26.5 mL/min (by C-G formula based on SCr of 1.64 mg/dL (H)). ------------------------------------------------------------------------------------------------------------------ No results for input(s): TSH, T4TOTAL, T3FREE, THYROIDAB in the last 72 hours.  Invalid input(s): FREET3  Cardiac Enzymes  Recent Labs Lab 11/11/16 1239 11/11/16 1934 11/12/16 0030  TROPONINI 0.74* 1.20* 0.92*   ------------------------------------------------------------------------------------------------------------------ Invalid input(s): POCBNP ---------------------------------------------------------------------------------------------------------------  RADIOLOGY: Ct Angio Chest Pe W And/or Wo Contrast  Result Date: 11/11/2016 CLINICAL DATA:  Dyspnea, hypoxia, syncope EXAM: CT ANGIOGRAPHY CHEST WITH CONTRAST TECHNIQUE: Multidetector CT imaging of the chest was performed using the standard protocol during bolus administration of intravenous contrast. Multiplanar CT image reconstructions and MIPs were obtained to evaluate the vascular anatomy. CONTRAST:  60 mL Isovue 370 COMPARISON:  None. FINDINGS: Cardiovascular: Satisfactory  opacification of the pulmonary arteries to the segmental level. Pulmonary embolus in the right main pulmonary artery. Extensive pulmonary embolus in the lobar and segmental branches of the right upper lobe,  right middle lobe and right lower lobe. Pulmonary embolus in the segmental branches of the left lower lobe. Normal heart size. No pericardial effusion. Normal caliber thoracic aorta. Thoracic aortic atherosclerosis. Mediastinum/Nodes: No enlarged mediastinal, hilar, or axillary lymph nodes. Thyroid gland, trachea, and esophagus demonstrate no significant findings. Lungs/Pleura: Lungs are clear. No pleural effusion or pneumothorax. Upper Abdomen: No acute abnormality. Musculoskeletal: No chest wall abnormality. No acute or significant osseous findings. Review of the MIP images confirms the above findings. IMPRESSION: 1. Bilateral pulmonary emboli more extensive on the right. Positive for acute PE with CTevidence of right heart strain (RV/LV Ratio = 1.04) consistent with at least submassive (intermediate risk) PE. The presence of right heart strain has been associated with an increased risk of morbidity and mortality. Critical Value/emergent results were called by telephone at the time of interpretation on 11/11/2016 at 10:29 am to Dr. Daryel November , who verbally acknowledged these results. Electronically Signed   By: Elige Ko   On: 11/11/2016 10:33   US Venous Img Lower Bilateral  Result Date: 11/12/2016 CLINICAL DATA:  Pulmonary emboli.  Evaluate for lower extremity DVT. EXAM: BILATERAL LOWER EXTREMITY VENOUS DOPPLER ULTRASOUND TECHNIQUE: Gray-scale sonography with graded compression, as well as color Doppler and duplex ultrasound were performed to evaluate the lower extremity deep venous systems from the level of the common femoral vein and including the common femoral, femoral, profunda femoral, popliteal and calf veins including the posterior tibial, peroneal and gastrocnemius veins when visible.  The superficial great saphenous vein was also interrogated. Spectral Doppler was utilized to evaluate flow at rest and with distal augmentation maneuvers in the common femoral, femoral and popliteal veins. COMPARISON:  None. FINDINGS: RIGHT LOWER EXTREMITY Common Femoral Vein: No evidence of thrombus. Normal compressibility, respiratory phasicity and response to augmentation. Saphenofemoral Junction: No evidence of thrombus. Normal compressibility and flow on color Doppler imaging. Profunda Femoral Vein: No evidence of thrombus. Normal compressibility and flow on color Doppler imaging. Femoral Vein: No evidence of thrombus. Normal compressibility, respiratory phasicity and response to augmentation. Popliteal Vein: No evidence of thrombus. Normal compressibility, respiratory phasicity and response to augmentation. Calf Veins: No evidence of thrombus. Normal compressibility and flow on color Doppler imaging. LEFT LOWER EXTREMITY Common Femoral Vein: No evidence of thrombus. Normal compressibility, respiratory phasicity and response to augmentation. Saphenofemoral Junction: No evidence of thrombus. Normal compressibility and flow on color Doppler imaging. Profunda Femoral Vein: No evidence of thrombus. Normal compressibility and flow on color Doppler imaging. Femoral Vein: No evidence of thrombus. Normal compressibility, respiratory phasicity and response to augmentation. Popliteal Vein: No evidence of thrombus. Normal compressibility, respiratory phasicity and response to augmentation. Calf Veins: No evidence of thrombus. Normal compressibility and flow on color Doppler imaging. IMPRESSION: No evidence of deep venous thrombosis. Electronically Signed   By: Richarda Overlie M.D.   On: 11/12/2016 10:22    EKG:  Orders placed or performed during the hospital encounter of 11/11/16  . ED EKG  . ED EKG  . EKG 12-Lead  . EKG 12-Lead    ASSESSMENT AND PLAN:  Active Problems:   Acute respiratory failure (HCC) #1.  Acute respiratory failure with hypoxia due to pulmonary embolism, continue patient on heparin IV, change to Eliquis upon discharge #2. Metabolic acidosis, resolved with therapy #3. Chronic renal insufficiency, close to baseline, creatinine 1.4 in March 2017, follow closely. Urinalysis was unremarkable, no obvious rash. Rate infection #4. Hypokalemia, resolved #5. Elevated troponin, likely demand ischemia, getting echocardiogram and cardiology consultation if needed #  6. Leukocytosis, likely stress related, follow with therapy #7. thrombocytopenia, likely consumption, follow in the morning #8 right leg pain, get pelvic x-ray to rule out fractures after fall, get physical therapist involved recommendations, pain medications as needed  Management plans discussed with the patient, family and they are in agreement.   DRUG ALLERGIES: No Known Allergies  CODE STATUS:     Code Status Orders        Start     Ordered   11/11/16 1213  Full code  Continuous     11/11/16 1212    Code Status History    Date Active Date Inactive Code Status Order ID Comments User Context   This patient has a current code status but no historical code status.      TOTAL TIME TAKING CARE OF THIS PATIENT: 50 minutes.  Discussed with patient's daughter, all questions were answered, voiced understanding  Laken Lobato M.D on 11/12/2016 at 1:43 PM  Between 7am to 6pm - Pager - [phone]  After 6pm go to [email or web address] - password [email or web address] [email or web address]  [facility]  Office  [phone]  CC: Primary care physician; Leotis ShamesSingh,Jasmine, MD

## 2016-11-12 NOTE — Progress Notes (Signed)
ANTICOAGULATION CONSULT NOTE - Initial Consult  Pharmacy Consult for Heparin Drip Indication: pulmonary embolus  No Known Allergies  Patient Measurements: Height: 5\' 6"  (167.6 cm) Weight: 160 lb (72.6 kg) IBW/kg (Calculated) : 59.3 Heparin Dosing Weight: 72.6 kg  Vital Signs: Temp: 98.2 F (36.8 C) (11/16 0742) Temp Source: Oral (11/16 0742) BP: 149/82 (11/16 1128) Pulse Rate: 100 (11/16 1128)  Labs:  Recent Labs  11/11/16 0851 11/11/16 0926 11/11/16 1239 11/11/16 1934 11/12/16 0030 11/12/16 0448 11/12/16 1536  HGB  --  12.1  --   --   --   --  11.2*  HCT  --  36.7  --   --   --   --  33.7*  PLT  --  145*  --   --   --   --  141*  APTT 24  --   --   --   --   --   --   LABPROT 13.1  --   --   --   --   --   --   INR 0.99  --   --   --   --   --   --   HEPARINUNFRC  --   --   --  1.44*  --  0.71* 1.11*  CREATININE 1.36*  --   --   --   --  1.64*  --   TROPONINI 0.17*  --  0.74* 1.20* 0.92*  --   --     Estimated Creatinine Clearance: 26.5 mL/min (by C-G formula based on SCr of 1.64 mg/dL (H)).   Medical History: Past Medical History:  Diagnosis Date  . Chronic kidney disease   . Diabetes mellitus without complication (HCC)   . High cholesterol   . Hypertension   . TIA (transient ischemic attack)   . Vascular dementia     Medications:  Infusions:  . heparin      Assessment: 82 yo female from nursing home after vomiting and found unresponsive by staff.  CT angiogram positive for acute PE Goal of Therapy:  Heparin level 0.3-0.7 units/ml Monitor platelets by anticoagulation protocol: Yes   Plan:  Give 4000 units bolus x 1 Start heparin infusion at 1100 units/hr Check anti-Xa level in 8 hours and daily while on heparin Continue to monitor H&H and platelets   11/15:  HL @ 19:30 = 1.44  Will hold heparin drip for 1 hr and restart heparin gtt @ 22:30 at 900 units/hr. Will recheck HL on 11/16 with 0630.    11/16 AM heparin level 0.71. Decrease to  850 units/hr and recheck in 8 hours.  11/16 PM Heparin level 1.11. Will hold drip for 1 hours and decrease drip to Heparin 750 units/hr. F/U heparin level in 8 hours.   Gardner CandleSheema M Terrin Meddaugh, PharmD Clinical Pharmacist  11/12/2016,5:53 PM

## 2016-11-12 NOTE — [place] FL2 (Addendum)
[place] MEDICAID FL2 LEVEL OF CARE SCREENING TOOL     IDENTIFICATION  Patient Name: Theresa Santos Birthdate: 05/15/1931 Sex: female Admission Date (Current Location): 11/11/2016  [place]   Facility and Address:  [facility], [address], [place], [place] [address]      Provider Number: [id number]  Attending Physician Name and Address:  Katharina Caperima Vaickute, MD  Relative Name and Phone Number:       Current Level of Care: Hospital Recommended Level of Care: Skilled Nursing Facility Prior Approval Number:    Date Approved/Denied:   PASRR Number:   [id number]    Discharge Plan: SNF placement     Current Diagnoses: Patient Active Problem List   Diagnosis Date Noted  . Acute respiratory failure (HCC) 11/11/2016    Orientation RESPIRATION BLADDER Height & Weight     Self, Time  O2 (Nasal Cannula 2L/min) Continent Weight: 160 lb (72.6 kg) Height:  5\' 6"  (167.6 cm)  BEHAVIORAL SYMPTOMS/MOOD NEUROLOGICAL BOWEL NUTRITION STATUS   (None. )  (None.) Continent Diet (Diet: Carb Modified )  AMBULATORY STATUS COMMUNICATION OF NEEDS Skin   Limited Assist  Verbally Normal                       Personal Care Assistance Level of Assistance  Bathing, Feeding, Dressing Bathing Assistance: Limited assistance Feeding assistance: Independent Dressing Assistance: Limited assistance     Functional Limitations Info  Sight, Hearing, Speech Sight Info: Adequate Hearing Info: Adequate Speech Info: Impaired (Upper Dentures)    SPECIAL CARE FACTORS FREQUENCY  PT (By licensed PT), OT (By licensed OT)     PT and OT 5x a week.              Contractures      Additional Factors Info  Code Status, Allergies, Insulin Sliding Scale Code Status Info:  (Full Code ) Allergies Info:  (No Known Allergies )   Insulin Sliding Scale Info:  (NovoLog)       Current Medications (11/12/2016):  This is the current hospital  active medication list Current Facility-Administered Medications  Medication Dose Route Frequency Provider Last Rate Last Dose  . 0.9 %  sodium chloride infusion  250 mL Intravenous PRN Gracelyn NurseJohn D Johnston, MD      . acetaminophen (TYLENOL) tablet 650 mg  650 mg Oral Q6H PRN Gracelyn NurseJohn D Johnston, MD   650 mg at 11/12/16 1050   Or  . acetaminophen (TYLENOL) suppository 650 mg  650 mg Rectal Q6H PRN Gracelyn NurseJohn D Johnston, MD      . aspirin EC tablet 81 mg  81 mg Oral Daily Gracelyn NurseJohn D Johnston, MD   81 mg at 11/12/16 [id number]  . atorvastatin (LIPITOR) tablet 10 mg  10 mg Oral Daily Gracelyn NurseJohn D Johnston, MD   10 mg at 11/12/16 [id number]  . dorzolamide-timolol (COSOPT) 22.3-6.8 MG/ML ophthalmic solution 1 drop  1 drop Both Eyes BID Gracelyn NurseJohn D Johnston, MD   1 drop at 11/12/16 0840  . heparin ADULT infusion 100 units/mL (25000 units/26950mL sodium chloride 0.45%)  850 Units/hr Intravenous Continuous Gracelyn NurseJohn D Johnston, MD 8.5 mL/hr at 11/12/16 0839 850 Units/hr at 11/12/16 0839  . insulin aspart (novoLOG) injection 0-5 Units  0-5 Units Subcutaneous QHS Gracelyn NurseJohn D Johnston, MD      . insulin aspart (novoLOG) injection 0-9 Units  0-9 Units Subcutaneous TID WC Gracelyn NurseJohn D Johnston, MD   1 Units at 11/12/16 [phone]  . latanoprost (XALATAN)  0.005 % ophthalmic solution 1 drop  1 drop Both Eyes QHS Gracelyn NurseJohn D Johnston, MD   1 drop at 11/11/16 2133  . lisinopril (PRINIVIL,ZESTRIL) tablet 5 mg  5 mg Oral Daily Gracelyn NurseJohn D Johnston, MD   5 mg at 11/12/16 [id number]  . LORazepam (ATIVAN) tablet 0.5 mg  0.5 mg Oral Daily PRN Gracelyn NurseJohn D Johnston, MD      . multivitamin with minerals tablet 1 tablet  1 tablet Oral Daily Gracelyn NurseJohn D Johnston, MD   1 tablet at 11/12/16 [phone]  . sodium chloride flush (NS) 0.9 % injection 3 mL  3 mL Intravenous Q12H Gracelyn NurseJohn D Johnston, MD   3 mL at 11/11/16 2131  . sodium chloride flush (NS) 0.9 % injection 3 mL  3 mL Intravenous PRN Gracelyn NurseJohn D Johnston, MD      . vitamin B-12 (CYANOCOBALAMIN) tablet 1,000 mcg  1,000 mcg Oral Daily Gracelyn NurseJohn D Johnston, MD   1,000 mcg at  11/12/16 [id number]     Discharge Medications: Please see discharge summary for a list of discharge medications.  Relevant Imaging Results:  Relevant Lab Results:   Additional Information  (SSN: [id number])  Ralene BatheMackenzie Rayfield, Student-Social Work   Updated Windell MouldingEric Anterhaus, MSW, 11-13-2016

## 2016-11-12 NOTE — Progress Notes (Signed)
Received a call from pharmacist Barbara CowerJason that pt's heparin level is elevated at 1.44. Writer was advised to stop Heparin infusion going at 10011ml/hr at 2130 and to restart at 229ml/hr after an hour. Will carry out and continue to monitor.

## 2016-11-12 NOTE — Progress Notes (Signed)
Pt is unable to to withstand weight on her left leg. Left knee noted to be markedly swollen compared to the right, there is no redness or heat coming from the left knee. Pt does try to get oob and is oriented only to her self. She can become very fidgety and sets of the bed alarm frequently. She pulled out her IV as well this AM. She is more compliant when family is in the room.  She also has not been able to void on her own I&O cath performed pt tolerated well.Dr Winona LegatoVaickute is aware of all of the above. I called to have the patient put on the tele/sitter but was told the patient had to be put on a waiting list. I called the nursing supervisor and requested a sitter.

## 2016-11-12 NOTE — Clinical Social Work Note (Addendum)
Clinical Social Work Assessment  Patient Details  Name: Theresa Santos MRN: [id number] Date of Birth: 05/15/1931  Date of referral:  11/12/16               Reason for consult:  Facility Placement, Discharge Planning                Permission sought to share information with:  Facility Art therapist granted to share information::  Yes, Verbal Permission Granted  Name::      Theresa Santos::     Relationship::     Contact Information:     Housing/Transportation Living arrangements for the past 2 months:  [facility] [employer] ) Source of Information:  Adult Children Patient Interpreter Needed:  None Criminal Activity/Legal Involvement Pertinent to Current Situation/Hospitalization:  No - Comment as needed Significant Relationships:  Adult Children Lives with:  Facility Resident Do you feel safe going back to the place where you live?  Yes Need for family participation in patient care:  Yes (Comment)  Care giving concerns:  [facility] lived [facility] since July 2017. Patient is a resident on the memory care unit.    Social Worker assessment / plan:  Holiday representative (CSW) received verbal consult from RN in progression rounds that patient is from [facility]. PT has not worked with patient at this time. Social work Theatre manager met with patient and patient's daughter, [facility] at bedside. Patient is very confused. Patient was sitting up in bed watching TV. Patient has one daughter that lives in the area. Social work Theatre manager explained role of social work department. Per patient's daughter, patient has lived at [facility] since July 2017 on the memory care unit. Patient's daughter also stated that patient does not walk with a walker or cane and is very independent. CSW confirmed this with Counselling psychologist at [facility]. Theresa Santos also stated that patient is on no oxygen at the facility.  Patient's daughter  seemed concerned about patient going back to [facility] due to the lack of exercise she thinks patient is getting. Social work Theatre manager explained that PT will work with patient and determine whether patient needs a higher level of care or home health PT. Patient's daughter verbally agreed she understood. Per [facility] patient can return to [facility] if she is at baseline.    Fl2 completed   Employment status:  Retired Forensic scientist:  Medicare PT Recommendations:  Not assessed at this time Information / Referral to community resources:  [place]  Patient/Family's Response to care:  Patient daughter is agreeable to looking at SNF if PT recommends it.   Patient/Family's Understanding of and Emotional Response to Diagnosis, Current Treatment, and Prognosis:  Patient's daughter is concerned about patient going back to [facility] due to the lack of exercise patient is getting. Patient's daughter thanked social work Theatre manager for coming by.   Emotional Assessment Appearance:  Appears stated age Attitude/Demeanor/Rapport:    Affect (typically observed):  Accepting, Adaptable, Appropriate Orientation:  Oriented to Self, Oriented to Place Alcohol / Substance use:  Not Applicable Psych involvement (Current and /or in the community):  No (Comment)  Discharge Needs  Concerns to be addressed:  Basic Needs, Discharge Planning Concerns Readmission within the last 30 days:  No Current discharge risk:  None Barriers to Discharge:  Continued Medical Work up   [employer], [place] Work 11/12/2016, 1:49 PM

## 2016-11-13 ENCOUNTER — Inpatient Hospital Stay: Payer: Medicare Other

## 2016-11-13 ENCOUNTER — Encounter: Payer: Self-pay | Admitting: Orthopedic Surgery

## 2016-11-13 DIAGNOSIS — R778 Other specified abnormalities of plasma proteins: Secondary | ICD-10-CM

## 2016-11-13 DIAGNOSIS — R7989 Other specified abnormal findings of blood chemistry: Secondary | ICD-10-CM

## 2016-11-13 DIAGNOSIS — D696 Thrombocytopenia, unspecified: Secondary | ICD-10-CM

## 2016-11-13 DIAGNOSIS — E876 Hypokalemia: Secondary | ICD-10-CM

## 2016-11-13 DIAGNOSIS — E872 Acidosis, unspecified: Secondary | ICD-10-CM

## 2016-11-13 DIAGNOSIS — W19XXXA Unspecified fall, initial encounter: Secondary | ICD-10-CM

## 2016-11-13 DIAGNOSIS — N183 Chronic kidney disease, stage 3 unspecified: Secondary | ICD-10-CM

## 2016-11-13 DIAGNOSIS — M79604 Pain in right leg: Secondary | ICD-10-CM

## 2016-11-13 DIAGNOSIS — D72829 Elevated white blood cell count, unspecified: Secondary | ICD-10-CM

## 2016-11-13 DIAGNOSIS — I2699 Other pulmonary embolism without acute cor pulmonale: Secondary | ICD-10-CM

## 2016-11-13 LAB — CBC
HCT: 31.4 % — ABNORMAL LOW (ref 35.0–47.0)
HEMOGLOBIN: 10.6 g/dL — AB (ref 12.0–16.0)
MCH: 29.7 pg (ref 26.0–34.0)
MCHC: 33.7 g/dL (ref 32.0–36.0)
MCV: 88 fL (ref 80.0–100.0)
Platelets: 129 10*3/uL — ABNORMAL LOW (ref 150–440)
RBC: 3.56 MIL/uL — AB (ref 3.80–5.20)
RDW: 14.6 % — ABNORMAL HIGH (ref 11.5–14.5)
WBC: 17 10*3/uL — ABNORMAL HIGH (ref 3.6–11.0)

## 2016-11-13 LAB — ECHOCARDIOGRAM COMPLETE
HEIGHTINCHES: 66 in
Weight: 2560 oz

## 2016-11-13 LAB — CREATININE, SERUM
CREATININE: 1.34 mg/dL — AB (ref 0.44–1.00)
GFR calc Af Amer: 41 mL/min — ABNORMAL LOW (ref >60)
GFR calc non Af Amer: 36 mL/min — ABNORMAL LOW (ref >60)

## 2016-11-13 LAB — GLUCOSE, CAPILLARY
GLUCOSE-CAPILLARY: 138 mg/dL — AB (ref 65–99)
GLUCOSE-CAPILLARY: 197 mg/dL — AB (ref 65–99)
GLUCOSE-CAPILLARY: 150 mg/dL — AB (ref 65–99)
GLUCOSE-CAPILLARY: 152 mg/dL — AB (ref 65–99)

## 2016-11-13 LAB — HEPARIN LEVEL (UNFRACTIONATED): HEPARIN UNFRACTIONATED: 0.78 [IU]/mL — AB (ref 0.30–0.70)

## 2016-11-13 MED ORDER — LORAZEPAM 0.5 MG PO TABS: 0.5000 mg | ORAL_TABLET | Freq: Every day | ORAL | 0 refills | Status: AC | PRN

## 2016-11-13 MED ORDER — APIXABAN 5 MG PO TABS: 5.0000 mg | ORAL_TABLET | Freq: Two times a day (BID) | ORAL | Status: DC

## 2016-11-13 MED ORDER — CARVEDILOL 3.125 MG PO TABS
3.1250 mg | ORAL_TABLET | Freq: Two times a day (BID) | ORAL | Status: DC
  Administered 2016-11-13 – 2016-11-14 (×3): 3.125 mg via ORAL
  Filled 2016-11-13 (×3): qty 1

## 2016-11-13 MED ORDER — APIXABAN 5 MG PO TABS
10.0000 mg | ORAL_TABLET | Freq: Two times a day (BID) | ORAL | Status: DC
  Administered 2016-11-13 – 2016-11-14 (×3): 10 mg via ORAL
  Filled 2016-11-13 (×3): qty 2

## 2016-11-13 MED ORDER — TROLAMINE SALICYLATE 10 % EX CREA
TOPICAL_CREAM | Freq: Two times a day (BID) | CUTANEOUS | Status: DC
  Administered 2016-11-14: 10:00:00 via TOPICAL
  Filled 2016-11-13: qty 85

## 2016-11-13 MED ORDER — HYDROCODONE-ACETAMINOPHEN 5-325 MG PO TABS: 1.0000 | ORAL_TABLET | ORAL | 0 refills | Status: DC | PRN

## 2016-11-13 NOTE — Care Management Note (Signed)
Case Management Note  Patient Details  Name: Theresa Santos MRN: [id number] Date of Birth: 05/15/1931  Subjective/Objective:  If [facility] return to [facility] [facility] They have not opened her but will at DC.                   Action/Plan:   Expected Discharge Date:                  Expected Discharge Plan:     In-House Referral:     Discharge planning Services     Post Acute Care Choice:    Choice offered to:     DME Arranged:    DME Agency:     HH Arranged:    HH Agency:     Status of Service:     If discussed at [employer] Length of Stay Meetings, dates discussed:    Additional Comments:  Marily MemosLisa M Tyan Dy, RN 11/13/2016, 9:45 AM

## 2016-11-13 NOTE — Plan of Care (Signed)
Problem: Bowel/Gastric: Goal: Will not experience complications related to bowel motility Outcome: Progressing Pt is impaired from making progress with some goals because of her dementia. Consults have been completed, awaiting changes for pt's plan of care.

## 2016-11-13 NOTE — Progress Notes (Addendum)
ANTICOAGULATION CONSULT NOTE - Initial Consult  Pharmacy Consult for Heparin Drip Indication: pulmonary embolus  No Known Allergies  Patient Measurements: Height: 5\' 6"  (167.6 cm) Weight: 160 lb (72.6 kg) IBW/kg (Calculated) : 59.3 Heparin Dosing Weight: 72.6 kg  Vital Signs: Temp: 99.5 F (37.5 C) (11/17 0011) Temp Source: Oral (11/17 0011) BP: 158/82 (11/17 0011) Pulse Rate: 107 (11/17 0011)  Labs:  Recent Labs  11/11/16 0851 11/11/16 0926 11/11/16 1239  11/11/16 1934 11/12/16 0030 11/12/16 0448 11/12/16 1536 11/12/16 2336  HGB  --  12.1  --   --   --   --   --  11.2*  --   HCT  --  36.7  --   --   --   --   --  33.7*  --   PLT  --  145*  --   --   --   --   --  141*  --   APTT 24  --   --   --   --   --   --   --   --   LABPROT 13.1  --   --   --   --   --   --   --   --   INR 0.99  --   --   --   --   --   --   --   --   HEPARINUNFRC  --   --   --   < > 1.44*  --  0.71* 1.11* 0.78*  CREATININE 1.36*  --   --   --   --   --  1.64*  --   --   TROPONINI 0.17*  --  0.74*  --  1.20* 0.92*  --   --   --   < > = values in this interval not displayed.  Estimated Creatinine Clearance: 26.5 mL/min (by C-G formula based on SCr of 1.64 mg/dL (H)).   Medical History: Past Medical History:  Diagnosis Date  . Chronic kidney disease   . Diabetes mellitus without complication (HCC)   . High cholesterol   . Hypertension   . TIA (transient ischemic attack)   . Vascular dementia     Medications:  Infusions:  . heparin 750 Units/hr (11/12/16 1854)    Assessment: 82 yo female from nursing home after vomiting and found unresponsive by staff.  CT angiogram positive for acute PE Goal of Therapy:  Heparin level 0.3-0.7 units/ml Monitor platelets by anticoagulation protocol: Yes   Plan:  Give 4000 units bolus x 1 Start heparin infusion at 1100 units/hr Check anti-Xa level in 8 hours and daily while on heparin Continue to monitor H&H and platelets   11/15:  HL @  19:30 = 1.44  Will hold heparin drip for 1 hr and restart heparin gtt @ 22:30 at 900 units/hr. Will recheck HL on 11/16 with 0630.    11/16 AM heparin level 0.71. Decrease to 850 units/hr and recheck in 8 hours.  11/16 PM Heparin level 1.11. Will hold drip for 1 hours and decrease drip to Heparin 750 units/hr. F/U heparin level in 8 hours.   11/16 23:30 heparin level 0.78. Decrease to 700 units/hr and recheck in 8 hours.  Erich MontaneMcBane,Nattalie Santiesteban S, PharmD Clinical Pharmacist  11/13/2016,1:33 AM

## 2016-11-13 NOTE — Progress Notes (Signed)
Pt's daughter arrived at approx 1645 and was present for cardiology consult. This Clinical research associatewriter updated daughter as well, and daughter has left.Daughter is anxious for pt to be discharged by thanksgiving. Pt fed herself dinner.

## 2016-11-13 NOTE — Consult Note (Signed)
[facility] Cardiology Consultation Note  Patient ID: Eudelia Hiltunen, MRN: [id number], DOB/AGE: 80/19/1932 80 y.o. Admit date: 11/11/2016   Date of Consult: 11/13/2016 Primary Physician: Leotis Shames, MD Primary Cardiologist: None  Chief Complaint:  Chief Complaint  Patient presents with  . Loss of Consciousness  . Nausea  . Emesis   Reason for Consult: congestive heart failure  HPI: 82 y.o. female with known chronic kidney disease and diabetes with complication with essential hypertension and vascular disease status post previous TIA and vascular dementia who is had significant immobility in the last several months. With this immobility she has had lower extremity edema and now deep venous thrombosis with pulmonary embolism causing hypoxia. The patient was seen in the emergency room with these issues and was placed on appropriate management including anticoagulation. The patient has tolerated this anticoagulation well. With further evaluation of her concerns the patient had an echocardiogram to assess for right ventricular strain showing mild to moderate LV systolic dysfunction with ejection fraction of 30% and moderate valvular heart disease. This is also contributing to above issues. The patient is currently currently stable at this time with no evidence for further congestive heart failure symptoms and tolerating new medication management including carvedilol and ACE inhibitor  Past Medical History:  Diagnosis Date  . Chronic kidney disease   . Diabetes mellitus without complication (HCC)   . High cholesterol   . Hypertension   . TIA (transient ischemic attack)   . Vascular dementia   . Vitamin B12 deficiency       Surgical History:  Past Surgical History:  Procedure Laterality Date  . Left knee surgery     > 20 years ago     Home Meds: Prior to Admission medications   Medication Sig Start Date End Date Taking? Authorizing Provider  aspirin EC 81 MG tablet Take 81  mg by mouth daily.   Yes Historical Provider, MD  atorvastatin (LIPITOR) 10 MG tablet Take 10 mg by mouth daily.   Yes Historical Provider, MD  dorzolamide-timolol (COSOPT) 22.3-6.8 MG/ML ophthalmic solution Place 1 drop into both eyes 2 (two) times daily.   Yes Historical Provider, MD  latanoprost (XALATAN) 0.005 % ophthalmic solution Place 1 drop into both eyes at bedtime.   Yes Historical Provider, MD  lisinopril (PRINIVIL,ZESTRIL) 5 MG tablet Take 5 mg by mouth daily.   Yes Historical Provider, MD  Multiple Vitamin (THEREMS) TABS Take 1 tablet by mouth daily.   Yes Historical Provider, MD  vitamin B-12 (CYANOCOBALAMIN) 1000 MCG tablet Take 1,000 mcg by mouth daily.   Yes Historical Provider, MD  HYDROcodone-acetaminophen (NORCO/VICODIN) 5-325 MG tablet Take 1 tablet by mouth every 4 (four) hours as needed for moderate pain. 11/13/16   Katharina Caper, MD  LORazepam (ATIVAN) 0.5 MG tablet Take 1 tablet (0.5 mg total) by mouth daily as needed for anxiety (agitation/sundowning). 11/13/16   Katharina Caper, MD    Inpatient Medications:  . apixaban  10 mg Oral BID   Followed by  . [START ON 11/20/2016] apixaban  5 mg Oral BID  . aspirin EC  81 mg Oral Daily  . atorvastatin  10 mg Oral Daily  . carvedilol  3.125 mg Oral BID WC  . dorzolamide-timolol  1 drop Both Eyes BID  . insulin aspart  0-5 Units Subcutaneous QHS  . insulin aspart  0-9 Units Subcutaneous TID WC  . latanoprost  1 drop Both Eyes QHS  . lisinopril  5 mg Oral Daily  . multivitamin with  minerals  1 tablet Oral Daily  . sodium chloride flush  3 mL Intravenous Q12H  . trolamine salicylate   Topical BID  . vitamin B-12  1,000 mcg Oral Daily     Allergies: No Known Allergies  Social History   Social History  . Marital status: Widowed    Spouse name: N/A  . Number of children: N/A  . Years of education: N/A   Occupational History  . Not on file.   Social History Main Topics  . Smoking status: Never Smoker  . Smokeless  tobacco: Never Used  . Alcohol use No  . Drug use: No  . Sexual activity: No   Other Topics Concern  . Not on file   Social History Narrative  . No narrative on file     History reviewed. No pertinent family history.   Review of Systems Positive for Shortness of breath lower extremity pain Negative for: General:  chills, fever, night sweats or weight changes.  Cardiovascular: PND orthopnea syncope dizziness  Dermatological skin lesions rashes Respiratory: Cough congestion Urologic: Frequent urination urination at night and hematuria Abdominal: negative for nausea, vomiting, diarrhea, bright red blood per rectum, melena, or hematemesis Neurologic: negative for visual changes, and/or hearing changes  All other systems reviewed and are otherwise negative except as noted above.  Labs:  Recent Labs  11/11/16 0851 11/11/16 1239 11/11/16 1934 11/12/16 0030  TROPONINI 0.17* 0.74* 1.20* 0.92*   Lab Results  Component Value Date   WBC 17.0 (H) 11/13/2016   HGB 10.6 (L) 11/13/2016   HCT 31.4 (L) 11/13/2016   MCV 88.0 11/13/2016   PLT 129 (L) 11/13/2016    Recent Labs Lab 11/11/16 0851 11/12/16 0448 11/13/16 0453  NA 140 140  --   K 3.4* 3.9  --   CL 111 108  --   CO2 17* 24  --   BUN 20 29*  --   CREATININE 1.36* 1.64* 1.34*  CALCIUM 8.2* 8.5*  --   PROT 6.4*  --   --   BILITOT 0.8  --   --   ALKPHOS 88  --   --   ALT 29  --   --   AST 37  --   --   GLUCOSE 235* 151*  --    Lab Results  Component Value Date   CHOL 227 (H) 11/16/2014   HDL 60 11/16/2014   LDLCALC 144 (H) 11/16/2014   TRIG 115 11/16/2014   No results found for: DDIMER  Radiology/Studies:  Ct Angio Chest Pe W And/or Wo Contrast  Result Date: 11/11/2016 CLINICAL DATA:  Dyspnea, hypoxia, syncope EXAM: CT ANGIOGRAPHY CHEST WITH CONTRAST TECHNIQUE: Multidetector CT imaging of the chest was performed using the standard protocol during bolus administration of intravenous contrast. Multiplanar  CT image reconstructions and MIPs were obtained to evaluate the vascular anatomy. CONTRAST:  60 mL Isovue 370 COMPARISON:  None. FINDINGS: Cardiovascular: Satisfactory opacification of the pulmonary arteries to the segmental level. Pulmonary embolus in the right main pulmonary artery. Extensive pulmonary embolus in the lobar and segmental branches of the right upper lobe, right middle lobe and right lower lobe. Pulmonary embolus in the segmental branches of the left lower lobe. Normal heart size. No pericardial effusion. Normal caliber thoracic aorta. Thoracic aortic atherosclerosis. Mediastinum/Nodes: No enlarged mediastinal, hilar, or axillary lymph nodes. Thyroid gland, trachea, and esophagus demonstrate no significant findings. Lungs/Pleura: Lungs are clear. No pleural effusion or pneumothorax. Upper Abdomen: No acute abnormality. Musculoskeletal: No chest  wall abnormality. No acute or significant osseous findings. Review of the MIP images confirms the above findings. IMPRESSION: 1. Bilateral pulmonary emboli more extensive on the right. Positive for acute PE with CTevidence of right heart strain (RV/LV Ratio = 1.04) consistent with at least submassive (intermediate risk) PE. The presence of right heart strain has been associated with an increased risk of morbidity and mortality. Critical Value/emergent results were called by telephone at the time of interpretation on 11/11/2016 at 10:29 am to Dr. Daryel November , who verbally acknowledged these results. Electronically Signed   By: Elige Ko   On: 11/11/2016 10:33   Dg Pelvis Portable  Result Date: 11/12/2016 CLINICAL DATA:  Bilateral hip pain EXAM: PORTABLE PELVIS 1-2 VIEWS COMPARISON:  None. FINDINGS: Negative for fracture or mass. Hip joint normal bilaterally. Contrast in the urinary bladder CT chest with contrast yesterday. IMPRESSION: Negative. Electronically Signed   By: Marlan Palau M.D.   On: 11/12/2016 14:29   US Venous Img Lower  Bilateral  Result Date: 11/12/2016 CLINICAL DATA:  Pulmonary emboli.  Evaluate for lower extremity DVT. EXAM: BILATERAL LOWER EXTREMITY VENOUS DOPPLER ULTRASOUND TECHNIQUE: Gray-scale sonography with graded compression, as well as color Doppler and duplex ultrasound were performed to evaluate the lower extremity deep venous systems from the level of the common femoral vein and including the common femoral, femoral, profunda femoral, popliteal and calf veins including the posterior tibial, peroneal and gastrocnemius veins when visible. The superficial great saphenous vein was also interrogated. Spectral Doppler was utilized to evaluate flow at rest and with distal augmentation maneuvers in the common femoral, femoral and popliteal veins. COMPARISON:  None. FINDINGS: RIGHT LOWER EXTREMITY Common Femoral Vein: No evidence of thrombus. Normal compressibility, respiratory phasicity and response to augmentation. Saphenofemoral Junction: No evidence of thrombus. Normal compressibility and flow on color Doppler imaging. Profunda Femoral Vein: No evidence of thrombus. Normal compressibility and flow on color Doppler imaging. Femoral Vein: No evidence of thrombus. Normal compressibility, respiratory phasicity and response to augmentation. Popliteal Vein: No evidence of thrombus. Normal compressibility, respiratory phasicity and response to augmentation. Calf Veins: No evidence of thrombus. Normal compressibility and flow on color Doppler imaging. LEFT LOWER EXTREMITY Common Femoral Vein: No evidence of thrombus. Normal compressibility, respiratory phasicity and response to augmentation. Saphenofemoral Junction: No evidence of thrombus. Normal compressibility and flow on color Doppler imaging. Profunda Femoral Vein: No evidence of thrombus. Normal compressibility and flow on color Doppler imaging. Femoral Vein: No evidence of thrombus. Normal compressibility, respiratory phasicity and response to augmentation. Popliteal  Vein: No evidence of thrombus. Normal compressibility, respiratory phasicity and response to augmentation. Calf Veins: No evidence of thrombus. Normal compressibility and flow on color Doppler imaging. IMPRESSION: No evidence of deep venous thrombosis. Electronically Signed   By: Richarda Overlie M.D.   On: 11/12/2016 10:22   Dg Knee 3 Views Left  Result Date: 11/13/2016 CLINICAL DATA:  Left knee pain and swelling. EXAM: LEFT KNEE - 3 VIEW COMPARISON:  None. FINDINGS: Large joint effusion without acute fracture or subluxation. There may be a small intra-articular ossified body in the suprapatellar joint space. No erosion. Tricompartmental osteoarthritis with diffuse marginal spurring and joint narrowing. Osteopenia. IMPRESSION: 1. Large joint effusion without acute osseous finding. 2. Tricompartmental osteoarthritis. Electronically Signed   By: Marnee Spring M.D.   On: 11/13/2016 10:42    EKG: Normal sinus rhythm with nonspecific ST changes  Weights: Filed Weights   11/11/16 0851  Weight: 72.6 kg (160 lb)  Physical Exam: Blood pressure (!) 153/72, pulse 99, temperature 100 F (37.8 C), temperature source Oral, resp. rate (!) 21, height 5\' 6"  (1.676 m), weight 72.6 kg (160 lb), SpO2 95 %. Body mass index is 25.82 kg/m. General: Well developed, well nourished, in no acute distress. Head eyes ears nose throat: Normocephalic, atraumatic, sclera non-icteric, no xanthomas, nares are without discharge. No apparent thyromegaly and/or mass  Lungs: Normal respiratory effort.  no wheezes, no rales, some rhonchi.  Heart: RRR with normal S1 S2. no murmur gallop, no rub, PMI is normal size and placement, carotid upstroke normal without bruit, jugular venous pressure is normal Abdomen: Soft, non-tender, non-distended with normoactive bowel sounds. No hepatomegaly. No rebound/guarding. No obvious abdominal masses. Abdominal aorta is normal size without bruit Extremities: 1+ edema. no cyanosis, no clubbing,  no ulcers  Peripheral : 2+ bilateral upper extremity pulses, 1 + bilateral femoral pulses, 1 + bilateral dorsal pedal pulse Neuro: Not Alert and oriented. No facial asymmetry. No focal deficit. Moves all extremities spontaneously. Musculoskeletal: Normal muscle tone without kyphosis Psych:  Responds to questions appropriately with a normal affect.    Assessment: 82 year old female with diabetes with complication essential hypertension makes hyperlipidemia and new onset lower extremity edema with deep venous thrombosis and pulmonary embolism and hypoxia also with valvular heart disease with mitral valve insufficiency and moderate dilated cardiomyopathy with systolic dysfunction heart failure now slightly improved  Plan: 1. Continue lisinopril carvedilol combination for systolic dysfunction congestive heart failure and lower extremity edema with adjustments of medication management dosages while increasing ambulation 2. Anticoagulation for further risk reduction of further deep venous thrombosis and pulmonary embolism 3. No further cardiac diagnostics necessary at this time 4. Begin ambulation and rehabilitation with possible discharged home from cardiac standpoint and follow-up in one to 2 weeks for further adjustments of medication management  Signed, Lamar BlinksBruce J Zoriyah Scheidegger M.D. [facility] 11/13/2016, 5:26 PM

## 2016-11-13 NOTE — Progress Notes (Signed)
Shift assessment completed. Pt is resting in bed, alert, oriented to self only. Skin is warm and dry, pt is on room air, respirations are unlabored, lungs decreased to bases. Monitor in place, ST noted. Abdomen si soft, bs heard. PIV #20 intact to R wrist with iv heparin infusing at 337mls/hr, Dr. Seth BakeV in on rounds. Foley draining yellow urine, ppp, pt has some edema to l knee. Per Dr., pt to be started on eliquis, and then heparin drip can be dc'd one hour later. Pt has mitts on bilat to keep iv site intact, these were removed so pt could eat breakfast and then were replaced. Pt is restless and picking at the bedding, has not attempted to get oob, is on camera for safety. Hob is elevated.

## 2016-11-13 NOTE — Consult Note (Signed)
ORTHOPAEDIC CONSULTATION  PATIENT NAME: Theresa Santos DOB: 05/15/1931  MRN: [id number]  REQUESTING PHYSICIAN: Katharina Caperima Vaickute, MD  Chief Complaint: Left knee swelling  HPI: Theresa Santos is a 82 y.o. female who complains of  some left knee pain and swelling. The patient was admitted after being diagnosed with pulmonary emboli. She was subsequently placed on a heparin drip. There is no documentation at the time of admission of a left knee effusion or swelling. The patient has dementia and is unable to provide a history of the symptoms. Nursing first documented left knee swelling yesterday afternoon. There is no apparent trauma or aggravating event. The patient apparently has a remote history of left knee surgery.  Past Medical History:  Diagnosis Date  . Chronic kidney disease   . Diabetes mellitus without complication (HCC)   . High cholesterol   . Hypertension   . TIA (transient ischemic attack)   . Vascular dementia   . Vitamin B12 deficiency    Past Surgical History:  Procedure Laterality Date  . Left knee surgery     > 20 years ago   Social History   Social History  . Marital status: Widowed    Spouse name: N/A  . Number of children: N/A  . Years of education: N/A   Social History Main Topics  . Smoking status: Never Smoker  . Smokeless tobacco: Never Used  . Alcohol use No  . Drug use: No  . Sexual activity: No   Other Topics Concern  . None   Social History Narrative  . None   History reviewed. No pertinent family history. No Known Allergies Prior to Admission medications   Medication Sig Start Date End Date Taking? Authorizing Provider  aspirin EC 81 MG tablet Take 81 mg by mouth daily.   Yes Historical Provider, MD  atorvastatin (LIPITOR) 10 MG tablet Take 10 mg by mouth daily.   Yes Historical Provider, MD  dorzolamide-timolol (COSOPT) 22.3-6.8 MG/ML ophthalmic solution Place 1 drop into both eyes 2 (two) times daily.   Yes Historical Provider, MD   latanoprost (XALATAN) 0.005 % ophthalmic solution Place 1 drop into both eyes at bedtime.   Yes Historical Provider, MD  lisinopril (PRINIVIL,ZESTRIL) 5 MG tablet Take 5 mg by mouth daily.   Yes Historical Provider, MD  Multiple Vitamin (THEREMS) TABS Take 1 tablet by mouth daily.   Yes Historical Provider, MD  vitamin B-12 (CYANOCOBALAMIN) 1000 MCG tablet Take 1,000 mcg by mouth daily.   Yes Historical Provider, MD  HYDROcodone-acetaminophen (NORCO/VICODIN) 5-325 MG tablet Take 1 tablet by mouth every 4 (four) hours as needed for moderate pain. 11/13/16   Katharina Caperima Vaickute, MD  LORazepam (ATIVAN) 0.5 MG tablet Take 1 tablet (0.5 mg total) by mouth daily as needed for anxiety (agitation/sundowning). 11/13/16   Katharina Caperima Vaickute, MD   Dg Pelvis Portable  Result Date: 11/12/2016 CLINICAL DATA:  Bilateral hip pain EXAM: PORTABLE PELVIS 1-2 VIEWS COMPARISON:  None. FINDINGS: Negative for fracture or mass. Hip joint normal bilaterally. Contrast in the urinary bladder CT chest with contrast yesterday. IMPRESSION: Negative. Electronically Signed   By: Marlan Palauharles  Clark M.D.   On: 11/12/2016 14:29   [place] Venous Img Lower Bilateral  Result Date: 11/12/2016 CLINICAL DATA:  Pulmonary emboli.  Evaluate for lower extremity DVT. EXAM: BILATERAL LOWER EXTREMITY VENOUS DOPPLER ULTRASOUND TECHNIQUE: Gray-scale sonography with graded compression, as well as color Doppler and duplex ultrasound were performed to evaluate the lower extremity deep venous systems from the level of the common femoral  vein and including the common femoral, femoral, profunda femoral, popliteal and calf veins including the posterior tibial, peroneal and gastrocnemius veins when visible. The superficial great saphenous vein was also interrogated. Spectral Doppler was utilized to evaluate flow at rest and with distal augmentation maneuvers in the common femoral, femoral and popliteal veins. COMPARISON:  None. FINDINGS: RIGHT LOWER EXTREMITY Common Femoral  Vein: No evidence of thrombus. Normal compressibility, respiratory phasicity and response to augmentation. Saphenofemoral Junction: No evidence of thrombus. Normal compressibility and flow on color Doppler imaging. Profunda Femoral Vein: No evidence of thrombus. Normal compressibility and flow on color Doppler imaging. Femoral Vein: No evidence of thrombus. Normal compressibility, respiratory phasicity and response to augmentation. Popliteal Vein: No evidence of thrombus. Normal compressibility, respiratory phasicity and response to augmentation. Calf Veins: No evidence of thrombus. Normal compressibility and flow on color Doppler imaging. LEFT LOWER EXTREMITY Common Femoral Vein: No evidence of thrombus. Normal compressibility, respiratory phasicity and response to augmentation. Saphenofemoral Junction: No evidence of thrombus. Normal compressibility and flow on color Doppler imaging. Profunda Femoral Vein: No evidence of thrombus. Normal compressibility and flow on color Doppler imaging. Femoral Vein: No evidence of thrombus. Normal compressibility, respiratory phasicity and response to augmentation. Popliteal Vein: No evidence of thrombus. Normal compressibility, respiratory phasicity and response to augmentation. Calf Veins: No evidence of thrombus. Normal compressibility and flow on color Doppler imaging. IMPRESSION: No evidence of deep venous thrombosis. Electronically Signed   By: Richarda OverlieAdam  Henn M.D.   On: 11/12/2016 10:22   Dg Knee 3 Views Left  Result Date: 11/13/2016 CLINICAL DATA:  Left knee pain and swelling. EXAM: LEFT KNEE - 3 VIEW COMPARISON:  None. FINDINGS: Large joint effusion without acute fracture or subluxation. There may be a small intra-articular ossified body in the suprapatellar joint space. No erosion. Tricompartmental osteoarthritis with diffuse marginal spurring and joint narrowing. Osteopenia. IMPRESSION: 1. Large joint effusion without acute osseous finding. 2. Tricompartmental  osteoarthritis. Electronically Signed   By: Marnee SpringJonathon  Watts M.D.   On: 11/13/2016 10:42    ROS: Unable to obtain review of systems secondary to patient's dementia.  Physical Exam: General: Alert and  in no acute distress. The patient is not oriented to place, situation,  or time. HEENT: Atraumatic and normocephalic. Sclera are clear. Extraocular motion is intact. Oropharynx is clear with moist mucosa. Neck: Supple, nontender, good range of motion. No JVD or carotid bruits. Lungs: Clear to auscultation bilaterally. Cardiovascular: Regular rate and rhythm with normal S1 and S2. No murmurs. No gallops or rubs. Pedal pulses are palpable bilaterally. Homans test is negative bilaterally. No significant pretibial or ankle edema. Abdomen: Soft, nontender, and nondistended. Bowel sounds are present. Skin: No lesions in the area of chief complaint Neurologic: Awake, alert. Sensory function is grossly intact. Motor strength is felt to be 5 over 5 bilaterally. No clonus or tremor. Good motor coordination. Lymphatic: No axillary or cervical lymphadenopathy  MUSCULOSKELETAL: Examination of the left knee demonstrated significant degree of swelling as well as evidence of a knee effusion. No gross erythema or ecchymosis. The knee was stable to varus and valgus stress. Lachman's test was negative. No significant point tenderness to the medial or lateral joint lines. Knee range of motion is limited secondary to patient guarding.  Assessment: Left knee effusion Degenerative arthrosis of left knee  Plan: The patient's family was not present for questioning. I will investigate the possibility of a traumatic event to the left knee. There is no radiographic evidence of fracture or dislocation. The patient  may have a spontaneous hemarthrosis related to the anticoagulation with heparin. The option of aspiration of the knee will be discussed with family when they are available.  In the meantime, I have ordered  ice/cold therapy to the left knee. The patient may be weightbearing as tolerated to the left lower extremity. Physical therapy may continue with their evaluation.  James P. Angie Fava M.D.

## 2016-11-13 NOTE — Evaluation (Signed)
Physical Therapy Evaluation Patient Details Name: Theresa Santos MRN: [id number] DOB: 05/15/1931 Today's Date: 11/13/2016   History of Present Illness  Pt admitted for ARF with hypoxia. Elevated troponin noted, however trending downward. Pt now with B PEs noted, on heparin drip since 11/15 at 1124. Heparin drip discontinued at this time. Pt with negative imaging on L knee at this time. Sitter camera present in room.  Clinical Impression  Pt is a pleasant 82 year old female who was admitted for ARF with hypoxia. Pt performs bed mobility/transfers with max assist. Pt tried to stand with RW, however unable to secondary to L knee pain. Unable to flex knee past 45 degrees. Pt demonstrates deficits with strength/pain/mobility. Unable to perform further mobility. Pt is confused and is poor historian, however able to participate in therapy to some extent. Would benefit from skilled PT to address above deficits and promote optimal return to PLOF; recommend transition to STR upon discharge from acute hospitalization.       Follow Up Recommendations SNF    Equipment Recommendations  None recommended by PT    Recommendations for Other Services       Precautions / Restrictions Precautions Precautions: Fall Restrictions Weight Bearing Restrictions: No      Mobility  Bed Mobility Overal bed mobility: Needs Assistance;+2 for physical assistance Bed Mobility: Supine to Sit     Supine to sit: Max assist     General bed mobility comments: assist for moving B LE off bed and sitting at side of bed. Pt very guarded on L LE and needs heavy assist for transfer. Once seated at EOB, able to sit with upright posture  Transfers Overall transfer level: Needs assistance Equipment used: Rolling walker (2 wheeled) Transfers: Sit to/from Stand Sit to Stand: Max assist         General transfer comment: 2 attempts for standing, however unable to perform safely/correctly. Returned back supine in  bed  Ambulation/Gait                Stairs            Wheelchair Mobility    Modified Rankin (Stroke Patients Only)       Balance Overall balance assessment: History of Falls;Needs assistance Sitting-balance support: Feet supported Sitting balance-Leahy Scale: Fair       Standing balance-Leahy Scale: Zero Standing balance comment: unable to maintain balance secondary to pain in L leg                             Pertinent Vitals/Pain Pain Assessment: Faces Faces Pain Scale: Hurts even more Pain Location: L knee Pain Descriptors / Indicators: Dull Pain Intervention(s): Limited activity within patient's tolerance    Home Living Family/patient expects to be discharged to:: Assisted living               Home Equipment: None      Prior Function Level of Independence: Independent         Comments: pt is poor historian, however per daughter pt was independent with all mobility     Hand Dominance        Extremity/Trunk Assessment   Upper Extremity Assessment: Generalized weakness (B UE grossly 4/5)           Lower Extremity Assessment: Generalized weakness (B LE grossly 3+/5)         Communication   Communication: No difficulties  Cognition Arousal/Alertness: Awake/alert Behavior During Therapy[facility]  for tasks assessed/performed Overall Cognitive Status: History of cognitive impairments - at baseline                      General Comments      Exercises     Assessment/Plan    PT Assessment Patient needs continued PT services  PT Problem List Decreased strength;Decreased activity tolerance;Decreased balance;Decreased mobility;Pain          PT Treatment Interventions Gait training;DME instruction;Therapeutic exercise    PT Goals (Current goals can be found in the Care Plan section)  Acute Rehab PT Goals Patient Stated Goal: unable to state secondary to confusion PT Goal Formulation: Patient unable to  participate in goal setting Time For Goal Achievement: 11/27/16 Potential to Achieve Goals: Fair    Frequency Min 2X/week   Barriers to discharge Decreased caregiver support      Co-evaluation               End of Session Equipment Utilized During Treatment: Gait belt Activity Tolerance: Patient limited by pain Patient left: in bed;with bed alarm set Nurse Communication: Mobility status         Time: [id number] PT Time Calculation (min) (ACUTE ONLY): 22 min   Charges:   PT Evaluation $PT Eval Moderate Complexity: 1 Procedure     PT G Codes:        Lakie Mclouth 11/13/2016, 3:14 PM Theresa Santos, PT, DPT [phone]

## 2016-11-13 NOTE — Progress Notes (Signed)
Pt has had heparin drip dc'd, and mitts have been removed. Pt remains on camera for supervision/safety. This Clinical research associatewriter received call from pt's daughter and gave her update. Consults are pending.

## 2016-11-13 NOTE — Progress Notes (Signed)
Received call from [facility]. Verified pt has inpatient status, [facility] will place pt 's services on hold.

## 2016-11-13 NOTE — Progress Notes (Signed)
ANTICOAGULATION CONSULT NOTE - Initial Consult  Pharmacy Consult for apixaban Indication: pulmonary embolus  No Known Allergies  Patient Measurements: Height: 5\' 6"  (167.6 cm) Weight: 160 lb (72.6 kg) IBW/kg (Calculated) : 59.3  Vital Signs: Temp: 98.7 F (37.1 C) (11/17 0732) Temp Source: Oral (11/17 0732) BP: 152/82 (11/17 0732) Pulse Rate: 106 (11/17 0732)  Labs:  Recent Labs  11/11/16 0851  11/11/16 0926 11/11/16 1239  11/11/16 1934 11/12/16 0030 11/12/16 0448 11/12/16 1536 11/12/16 2336 11/13/16 0453  HGB  --   < > 12.1  --   --   --   --   --  11.2*  --  10.6*  HCT  --   --  36.7  --   --   --   --   --  33.7*  --  31.4*  PLT  --   --  145*  --   --   --   --   --  141*  --  129*  APTT 24  --   --   --   --   --   --   --   --   --   --   LABPROT 13.1  --   --   --   --   --   --   --   --   --   --   INR 0.99  --   --   --   --   --   --   --   --   --   --   HEPARINUNFRC  --   --   --   --   < > 1.44*  --  0.71* 1.11* 0.78*  --   CREATININE 1.36*  --   --   --   --   --   --  1.64*  --   --  1.34*  TROPONINI 0.17*  --   --  0.74*  --  1.20* 0.92*  --   --   --   --   < > = values in this interval not displayed.  Estimated Creatinine Clearance: 32.4 mL/min (by C-G formula based on SCr of 1.34 mg/dL (H)).   Medical History: Past Medical History:  Diagnosis Date  . Chronic kidney disease   . Diabetes mellitus without complication (HCC)   . High cholesterol   . Hypertension   . TIA (transient ischemic attack)   . Vascular dementia     Medications:  Scheduled:  . apixaban  10 mg Oral BID   Followed by  . [START ON 11/20/2016] apixaban  5 mg Oral BID  . aspirin EC  81 mg Oral Daily  . atorvastatin  10 mg Oral Daily  . dorzolamide-timolol  1 drop Both Eyes BID  . insulin aspart  0-5 Units Subcutaneous QHS  . insulin aspart  0-9 Units Subcutaneous TID WC  . latanoprost  1 drop Both Eyes QHS  . lisinopril  5 mg Oral Daily  . multivitamin with  minerals  1 tablet Oral Daily  . sodium chloride flush  3 mL Intravenous Q12H  . vitamin B-12  1,000 mcg Oral Daily    Assessment: Pharmacy consulted to dose apixaban in this 82 year old female who is currently on a heparin drip for PE.   Goal of Therapy:  Monitor platelets by anticoagulation protocol: Yes   Plan:  Scheduled heparin drip to stop at 1000 this morning. Will start apixaban at 1000  with a dose of apixaban 10 mg PO BID x 7 days followed by apixaban 5 mg PO BID.   Will monitor CBC and Plt every 3 days per protocol  Cindi CarbonMary M Aarit Kashuba, PharmD, BCPS Clinical Pharmacist 11/13/2016,9:05 AM

## 2016-11-13 NOTE — Progress Notes (Signed)
Palliative Medicine Consult Order Noted. Due to limited staffing, there will be a delay seeing this patient. Pall[facility], and patient will be evaluated then. Please call the Palliative Medicine Team office at [phone] if recommendations are needed in the interim.  Thank you for inviting us to see this patient.  Margret ChanceMelanie G. Maylynn Orzechowski, RN, BSN, [facility] 11/13/2016 11:19 AM Cell [phone] 8:00-4:00 Monday-Friday Office [phone]

## 2016-11-13 NOTE — Progress Notes (Signed)
[facility] [place] [facility]   PATIENT NAME: Theresa Santos    MR#:  [id number]  DATE OF BIRTH:  05/15/1931  SUBJECTIVE:  CHIEF COMPLAINT:   Chief Complaint  Patient presents with  . Loss of Consciousness  . Nausea  . Emesis  The patient is 82 year old female with past medical history significant for history of dementia,, CK D, diabetes mellitus, hyperlipidemia, hypertension, TIA, who presents to the hospital with complaints of syncopal episode of. On arrival to the hospital patient was hypoxic with O2 sats in 80s, CT angiogram was performed which revealed bilateral pulmonary embolism. Ultrasound of lower extremity showed no DVT. Patient was complaining of left lower extremity pain, pointing to left knee. Further investigation revealed significant joint effusion, increased warmth. Orthopedist surgeon feels that it was hemarthrosis, although therapy with heparin was initiated after fall. Patient denies any other discomfort. Echocardiogram revealed ejection fraction of 20%, diffuse hypokinesis     Review of Systems  Unable to perform ROS: Dementia    VITAL SIGNS: Blood pressure (!) 144/74, pulse 99, temperature 99.9 F (37.7 C), temperature source Oral, resp. rate 18, height 5\' 6"  (1.676 m), weight 72.6 kg (160 lb), SpO2 95 %.  PHYSICAL EXAMINATION:   GENERAL:  82 y.o.-year-old patient lying in the bed with no acute distress.  EYES: Pupils equal, round, reactive to light and accommodation. No scleral icterus. Extraocular muscles intact.  HEENT: Head atraumatic, normocephalic. Oropharynx and nasopharynx clear.  NECK:  Supple, no jugular venous distention. No thyroid enlargement, no tenderness.  LUNGS: Normal breath sounds bilaterally, no wheezing, rales,rhonchi or crepitation. No use of accessory muscles of respiration.  CARDIOVASCULAR: S1, S2 normal. No murmurs, rubs, or gallops.  ABDOMEN: Soft, nontender, nondistended. Bowel sounds present. No  organomegaly or mass.  EXTREMITIES: No pedal edema, cyanosis, or clubbing. Left knee joint is swollen, increased warmth was noted than some restricted range of motion, although patient is able to tolerate a passive movements.  NEUROLOGIC: Cranial nerves II through XII are intact. Muscle strength 5/5 in all extremities. Sensation intact. Gait not checked.  PSYCHIATRIC: The patient is alert and oriented x 3.  SKIN: No obvious rash, lesion, or ulcer.   ORDERS/RESULTS REVIEWED:   CBC  Recent Labs Lab 11/11/16 0926 11/12/16 1536 11/13/16 0453  WBC 18.5* 15.8* 17.0*  HGB 12.1 11.2* 10.6*  HCT 36.7 33.7* 31.4*  PLT 145* 141* 129*  MCV 90.3 88.9 88.0  MCH 29.9 29.6 29.7  MCHC 33.1 33.3 33.7  RDW 14.8* 14.7* 14.6*   ------------------------------------------------------------------------------------------------------------------  Chemistries   Recent Labs Lab 11/11/16 0851 11/12/16 0448 11/13/16 0453  NA 140 140  --   K 3.4* 3.9  --   CL 111 108  --   CO2 17* 24  --   GLUCOSE 235* 151*  --   BUN 20 29*  --   CREATININE 1.36* 1.64* 1.34*  CALCIUM 8.2* 8.5*  --   AST 37  --   --   ALT 29  --   --   ALKPHOS 88  --   --   BILITOT 0.8  --   --    ------------------------------------------------------------------------------------------------------------------ estimated creatinine clearance is 32.4 mL/min (by C-G formula based on SCr of 1.34 mg/dL (H)). ------------------------------------------------------------------------------------------------------------------ No results for input(s): TSH, T4TOTAL, T3FREE, THYROIDAB in the last 72 hours.  Invalid input(s): FREET3  Cardiac Enzymes  Recent Labs Lab 11/11/16 1239 11/11/16 1934 11/12/16 0030  TROPONINI 0.74* 1.20* 0.92*   ------------------------------------------------------------------------------------------------------------------ Invalid input(s):  POCBNP ---------------------------------------------------------------------------------------------------------------  RADIOLOGY: Dg Pelvis Portable  Result Date: 11/12/2016 CLINICAL DATA:  Bilateral hip pain EXAM: PORTABLE PELVIS 1-2 VIEWS COMPARISON:  None. FINDINGS: Negative for fracture or mass. Hip joint normal bilaterally. Contrast in the urinary bladder CT chest with contrast yesterday. IMPRESSION: Negative. Electronically Signed   By: Marlan Palauharles  Clark M.D.   On: 11/12/2016 14:29   [place] Venous Img Lower Bilateral  Result Date: 11/12/2016 CLINICAL DATA:  Pulmonary emboli.  Evaluate for lower extremity DVT. EXAM: BILATERAL LOWER EXTREMITY VENOUS DOPPLER ULTRASOUND TECHNIQUE: Gray-scale sonography with graded compression, as well as color Doppler and duplex ultrasound were performed to evaluate the lower extremity deep venous systems from the level of the common femoral vein and including the common femoral, femoral, profunda femoral, popliteal and calf veins including the posterior tibial, peroneal and gastrocnemius veins when visible. The superficial great saphenous vein was also interrogated. Spectral Doppler was utilized to evaluate flow at rest and with distal augmentation maneuvers in the common femoral, femoral and popliteal veins. COMPARISON:  None. FINDINGS: RIGHT LOWER EXTREMITY Common Femoral Vein: No evidence of thrombus. Normal compressibility, respiratory phasicity and response to augmentation. Saphenofemoral Junction: No evidence of thrombus. Normal compressibility and flow on color Doppler imaging. Profunda Femoral Vein: No evidence of thrombus. Normal compressibility and flow on color Doppler imaging. Femoral Vein: No evidence of thrombus. Normal compressibility, respiratory phasicity and response to augmentation. Popliteal Vein: No evidence of thrombus. Normal compressibility, respiratory phasicity and response to augmentation. Calf Veins: No evidence of thrombus. Normal compressibility  and flow on color Doppler imaging. LEFT LOWER EXTREMITY Common Femoral Vein: No evidence of thrombus. Normal compressibility, respiratory phasicity and response to augmentation. Saphenofemoral Junction: No evidence of thrombus. Normal compressibility and flow on color Doppler imaging. Profunda Femoral Vein: No evidence of thrombus. Normal compressibility and flow on color Doppler imaging. Femoral Vein: No evidence of thrombus. Normal compressibility, respiratory phasicity and response to augmentation. Popliteal Vein: No evidence of thrombus. Normal compressibility, respiratory phasicity and response to augmentation. Calf Veins: No evidence of thrombus. Normal compressibility and flow on color Doppler imaging. IMPRESSION: No evidence of deep venous thrombosis. Electronically Signed   By: Richarda OverlieAdam  Henn M.D.   On: 11/12/2016 10:22   Dg Knee 3 Views Left  Result Date: 11/13/2016 CLINICAL DATA:  Left knee pain and swelling. EXAM: LEFT KNEE - 3 VIEW COMPARISON:  None. FINDINGS: Large joint effusion without acute fracture or subluxation. There may be a small intra-articular ossified body in the suprapatellar joint space. No erosion. Tricompartmental osteoarthritis with diffuse marginal spurring and joint narrowing. Osteopenia. IMPRESSION: 1. Large joint effusion without acute osseous finding. 2. Tricompartmental osteoarthritis. Electronically Signed   By: Marnee SpringJonathon  Watts M.D.   On: 11/13/2016 10:42    EKG:  Orders placed or performed during the hospital encounter of 11/11/16  . ED EKG  . ED EKG  . EKG 12-Lead  . EKG 12-Lead    ASSESSMENT AND PLAN:  Principal Problem:   Acute respiratory failure with hypoxia (HCC) Active Problems:   Acute pulmonary embolism (HCC)   Metabolic acidosis   Elevated troponin   Thrombocytopenia (HCC)   Right leg pain   Fall   Hypokalemia   Leukocytosis   CKD (chronic kidney disease), stage III #1. Acute respiratory failure with hypoxia due to pulmonary embolism,.  Discontinue heparin, initiate patient on  Eliquis  #2. Metabolic acidosis, resolved with therapy #3. Chronic renal insufficiency,back to baseline, creatinine 1.4 in March 2017. Urinalysis was unremarkable, no obvious urinary tract  infection #4. Hypokalemia, resolved #5. Elevated troponin, likely demand ischemia, echocardiogram is markedly abnormal with a decreased ejection fraction and global hypokinesis, obtaining  cardiologist consultation, palliative care input . Initiate patient on Coreg, continue ACE inhibitor  #6. Leukocytosis, likely stress related,some worse today, follow in the morning  #7. thrombocytopenia, likely consumption, follow closely  #8 left knee effusion, orthopedic surgeon is suggesting possible hemarthrosis, although patient's fall happened before heparin was initiated. Follow clinically, supportive therapy, Aspercreme, continue physical therapist involvement #9. Cardiomyopathy with ejection fraction and in 20s, initiate Coreg, continue ACE inhibitor, his cardiologist involved for recommendations, getting palliative care involved as well   Management plans discussed with the patient, family and they are in agreement.   DRUG ALLERGIES: No Known Allergies  CODE STATUS:     Code Status Orders        Start     Ordered   11/11/16 1213  Full code  Continuous     11/11/16 1212    Code Status History    Date Active Date Inactive Code Status Order ID Comments User Context   This patient has a current code status but no historical code status.      TOTAL TIME TAKING CARE OF THIS PATIENT: 35 minutes.    Katharina Caper M.D on 11/13/2016 at 2:59 PM  Between 7am to 6pm - Pager - [phone]  After 6pm go to [email or web address] - password [email or web address] [facility]  [place]  Office  [phone]  CC: Primary care physician; Leotis Shames, MD

## 2016-11-13 NOTE — Clinical Social Work Note (Signed)
MSW attempted to contact patient's daughter to discuss SNF placement options, MSW left a message awaiting call back from family, patient has some confusion.  Ervin KnackEric R. Arlanda Shiplett, MSW [phone]  Mon-Fri 8a-4:30p 11/13/2016 5:14 PM

## 2016-11-14 DIAGNOSIS — M25462 Effusion, left knee: Secondary | ICD-10-CM

## 2016-11-14 DIAGNOSIS — M2500 Hemarthrosis, unspecified joint: Secondary | ICD-10-CM

## 2016-11-14 DIAGNOSIS — I429 Cardiomyopathy, unspecified: Secondary | ICD-10-CM

## 2016-11-14 LAB — CBC
HCT: 27.7 % — ABNORMAL LOW (ref 35.0–47.0)
Hemoglobin: 9.5 g/dL — ABNORMAL LOW (ref 12.0–16.0)
MCH: 30.8 pg (ref 26.0–34.0)
MCHC: 34.5 g/dL (ref 32.0–36.0)
MCV: 89.5 fL (ref 80.0–100.0)
PLATELETS: 121 10*3/uL — AB (ref 150–440)
RBC: 3.1 MIL/uL — ABNORMAL LOW (ref 3.80–5.20)
RDW: 14.7 % — AB (ref 11.5–14.5)
WBC: 13.5 10*3/uL — ABNORMAL HIGH (ref 3.6–11.0)

## 2016-11-14 LAB — GLUCOSE, CAPILLARY
GLUCOSE-CAPILLARY: 140 mg/dL — AB (ref 65–99)
Glucose-Capillary: 124 mg/dL — ABNORMAL HIGH (ref 65–99)

## 2016-11-14 MED ORDER — APIXABAN 5 MG PO TABS: 5.0000 mg | ORAL_TABLET | Freq: Two times a day (BID) | ORAL | 6 refills | Status: AC

## 2016-11-14 MED ORDER — CARVEDILOL 3.125 MG PO TABS: 3.1250 mg | ORAL_TABLET | Freq: Two times a day (BID) | ORAL | 5 refills | Status: AC

## 2016-11-14 MED ORDER — APIXABAN 5 MG PO TABS: 5.0000 mg | ORAL_TABLET | Freq: Two times a day (BID) | ORAL | 6 refills | Status: DC

## 2016-11-14 MED ORDER — TROLAMINE SALICYLATE 10 % EX CREA: TOPICAL_CREAM | Freq: Two times a day (BID) | CUTANEOUS | 0 refills | Status: AC

## 2016-11-14 MED ORDER — APIXABAN 5 MG PO TABS: 10.0000 mg | ORAL_TABLET | Freq: Two times a day (BID) | ORAL | 0 refills | Status: DC

## 2016-11-14 NOTE — Clinical Social Work Note (Signed)
CSW attempted to contact patient's family to discuss dc plan. The CSW left a detailed voicemail. The patient cannot dc until the family decides between returning to ALF or pursuing the SNF bed offer from [facility]. [facility] is standing by to accept patient, today. CSW will con't to follow.  Argentina PonderKaren Martha Kayla Deshaies, MSW, LCSW-A [phone]

## 2016-11-14 NOTE — Consult Note (Signed)
ORTHOPAEDICS PROGRESS NOTE  PATIENT NAME: Theresa Santos DOB: 05/15/1931  MRN: [id number]  Subjective: Patient states left knee "feels some better". No family is in the room.  Objective: Vital signs in last 24 hours: Temp:  [99.3 F (37.4 C)-100 F (37.8 C)] 99.3 F (37.4 C) (11/18 0453) Pulse Rate:  [89-99] 91 (11/18 0726) Resp:  [18-21] 18 (11/18 0726) BP: (121-153)/(58-74) 134/58 (11/18 0726) SpO2:  [94 %-96 %] 96 % (11/18 0726)  Intake/Output from previous day: 11/17 0701 - 11/18 0700 In: 0  Out: 500 [Urine:500]   Recent Labs  11/12/16 0448 11/12/16 1536 11/13/16 0453 11/14/16 0357  WBC  --  15.8* 17.0* 13.5*  HGB  --  11.2* 10.6* 9.5*  HCT  --  33.7* 31.4* 27.7*  PLT  --  141* 129* 121*  K 3.9  --   --   --   CL 108  --   --   --   CO2 24  --   --   --   BUN 29*  --   --   --   CREATININE 1.64*  --  1.34*  --   GLUCOSE 151*  --   --   --   CALCIUM 8.5*  --   --   --     EXAM General: Awake, alert Left knee: Moderate swelling, but improved. Positive effusion. No erythema. Mild tenderness to palpation. Guarding with attempts at range of motion. Calf is nontender.  Assessment: DJD of the left knee Left knee effusion  Plan: Continue with cold therapy and elevation. If she is discharged today, follow-up appointment in the office in 7-10 days.  James P. Angie FavaHooten, Jr. M.D.

## 2016-11-14 NOTE — Progress Notes (Signed)
Shift assessment completed at 0815. Since that time, pt's foley catheter removed and pt's piv removed by ana,Rn. This Clinical research associatewriter spoke to pt's daughter after case mgt notified her of pt's discharge, and explained that snf placement recommended due to pt's knee swelling and resulting immobility. Pt was told multiple times of her discharge but could not remember after a short period of time. Report given to [facility], and pt has been dc'd via EMS. This Clinical research associatewriter notified pt's granddaughter of pt's transfer and room number at [facility] per daughter's request.

## 2016-11-14 NOTE — Discharge Summary (Signed)
[facility] [place] at [facility]   PATIENT NAME: Theresa Santos    MR#:  [id number]  DATE OF BIRTH:  05/15/1931  DATE OF ADMISSION:  11/11/2016 ADMITTING PHYSICIAN: Gracelyn NurseJohn D Johnston, MD  DATE OF DISCHARGE: No discharge date for patient encounter.  PRIMARY CARE PHYSICIAN: Singh,Jasmine, MD     ADMISSION DIAGNOSIS:  Syncope and collapse [R55] Hypoxia [R09.02] Other acute pulmonary embolism without acute cor pulmonale (HCC) [I26.99]  DISCHARGE DIAGNOSIS:  Principal Problem:   Acute respiratory failure with hypoxia (HCC) Active Problems:   Acute pulmonary embolism (HCC)   Metabolic acidosis   Elevated troponin   Thrombocytopenia (HCC)   Right leg pain   Fall   Knee effusion, left   Hemarthrosis   Cardiomyopathy (HCC)   CKD (chronic kidney disease), stage III   Hypokalemia   Leukocytosis   SECONDARY DIAGNOSIS:   Past Medical History:  Diagnosis Date  . Chronic kidney disease   . Diabetes mellitus without complication (HCC)   . High cholesterol   . Hypertension   . TIA (transient ischemic attack)   . Vascular dementia   . Vitamin B12 deficiency     .pro HOSPITAL COURSE:  The patient is 82 year old female with past medical history significant for history of dementia,, CK D, diabetes mellitus, hyperlipidemia, hypertension, TIA, who presents to the hospital with complaints of syncopal episode of. On arrival to the hospital patient was hypoxic with O2 sats in 80s, CT angiogram was performed which revealed bilateral pulmonary embolism. Ultrasound of lower extremity showed no DVT. Patient was complaining of left lower extremity pain, pointing to left knee. Further investigation revealed left knee joint effusion, increased warmth. Orthopedist surgeon feels that it was hemarthrosis, although therapy with heparin was initiated after fall, the patient was initiated on Aspercreme topically and improved clinically. Patient was evaluated by physical  therapist and recommended SNF placement. Echocardiogram revealed ejection fraction of 25-30%%, diffuse hypokinesis , cardiologist saw patient in consultation and recommended medical therapy only. The patient was felty to be stable to be discharged to SNF today. Recommending palliative care follow up as outpatient.  Discussion by problem: #1. Acute respiratory failure with hypoxia due to pulmonary embolism,. Now off oxygen, O2 sats ar good on room air, follow closely on exertion and initiate Oxygen supplementation if needed #2 pulmonary embolism , acute, continue Eliquis loading for the next 6 days, then maintenance therapy as long as the patient is sedentary following platelet count closely as outpatient. Palliative care follow up is recommended in the facility.   #3. Metabolic acidosis, resolved with therapy #4. Chronic renal insufficiency,back to baseline with IVF administration, creatinine 1.4 in March 2017, 1.34 11/13/2016. Urinalysis was unremarkable, no obvious urinary tract  infection #5. Hypokalemia, resolved #6. Elevated troponin, likely demand ischemia, echocardiogram is markedly abnormal with a decreased ejection fraction of 25-30%,  global hypokinesis,  cardiologist saw patient in consultation, medical therapy was recommended, palliative care input . Continue patient on Coreg, lisinopril  #7. Leukocytosis, likely stress related,improving,  follow as outpatient.   #8. thrombocytopenia, likely consumption, follow closely as outpatient #9 left knee effusion, orthopedic surgeon is suspecting hemarthrosis, cold compresses were recommended by ortho,  supportive therapy, Aspercreme, continue physical therapy , weight bearing as tolerated #10. Cardiomyopathy with ejection fraction and in 25-30%, continue Coreg, continue ACE inhibitor, appreciate cardiologist recommendations, get palliative care involved as well as outpatient   DISCHARGE CONDITIONS:  stable  CONSULTS OBTAINED:  Treatment Team:   Lamar BlinksBruce J Kowalski, MD Fayrene FearingJames  P Hooten, MD  DRUG ALLERGIES:  No Known Allergies  DISCHARGE MEDICATIONS:   Current Discharge Medication List    START taking these medications   Details  !! apixaban (ELIQUIS) 5 MG TABS tablet Take 2 tablets (10 mg total) by mouth 2 (two) times daily. Qty: 12 tablet, Refills: 0    !! apixaban (ELIQUIS) 5 MG TABS tablet Take 1 tablet (5 mg total) by mouth 2 (two) times daily. Qty: 60 tablet, Refills: 6    carvedilol (COREG) 3.125 MG tablet Take 1 tablet (3.125 mg total) by mouth 2 (two) times daily with a meal. Qty: 60 tablet, Refills: 5    HYDROcodone-acetaminophen (NORCO/VICODIN) 5-325 MG tablet Take 1 tablet by mouth every 4 (four) hours as needed for moderate pain. Qty: 30 tablet, Refills: 0    trolamine salicylate (ASPERCREME) 10 % cream Apply topically 2 (two) times daily. Qty: 85 g, Refills: 0     !! - Potential duplicate medications found. Please discuss with provider.    CONTINUE these medications which have CHANGED   Details  LORazepam (ATIVAN) 0.5 MG tablet Take 1 tablet (0.5 mg total) by mouth daily as needed for anxiety (agitation/sundowning). Qty: 30 tablet, Refills: 0      CONTINUE these medications which have NOT CHANGED   Details  atorvastatin (LIPITOR) 10 MG tablet Take 10 mg by mouth daily.    dorzolamide-timolol (COSOPT) 22.3-6.8 MG/ML ophthalmic solution Place 1 drop into both eyes 2 (two) times daily.    latanoprost (XALATAN) 0.005 % ophthalmic solution Place 1 drop into both eyes at bedtime.    lisinopril (PRINIVIL,ZESTRIL) 5 MG tablet Take 5 mg by mouth daily.    Multiple Vitamin (THEREMS) TABS Take 1 tablet by mouth daily.    vitamin B-12 (CYANOCOBALAMIN) 1000 MCG tablet Take 1,000 mcg by mouth daily.      STOP taking these medications     aspirin EC 81 MG tablet          DISCHARGE INSTRUCTIONS:    The patient is to follow up with PCp  If you experience worsening of your admission symptoms, develop  shortness of breath, life threatening emergency, suicidal or homicidal thoughts you must seek medical attention immediately by calling 911 or calling your MD immediately  if symptoms less severe.  You Must read complete instructions/literature along with all the possible adverse reactions/side effects for all the Medicines you take and that have been prescribed to you. Take any new Medicines after you have completely understood and accept all the possible adverse reactions/side effects.   Please note  You were cared for by a hospitalist during your hospital stay. If you have any questions about your discharge medications or the care you received while you were in the hospital after you are discharged, you can call the unit and asked to speak with the hospitalist on call if the hospitalist that took care of you is not available. Once you are discharged, your primary care physician will handle any further medical issues. Please note that NO REFILLS for any discharge medications will be authorized once you are discharged, as it is imperative that you return to your primary care physician (or establish a relationship with a primary care physician if you do not have one) for your aftercare needs so that they can reassess your need for medications and monitor your lab values.    Today   CHIEF COMPLAINT:   Chief Complaint  Patient presents with  . Loss of Consciousness  . Nausea  .  Emesis    HISTORY OF PRESENT ILLNESS:  Theresa Santos  is a 82 y.o. female with a known history of dementia,, CK D, diabetes mellitus, hyperlipidemia, hypertension, TIA, who presents to the hospital with complaints of syncopal episode of. On arrival to the hospital patient was hypoxic with O2 sats in 80s, CT angiogram was performed which revealed bilateral pulmonary embolism. Ultrasound of lower extremity showed no DVT. Patient was complaining of left lower extremity pain, pointing to left knee. Further investigation  revealed left knee joint effusion, increased warmth. Orthopedist surgeon feels that it was hemarthrosis, although therapy with heparin was initiated after fall, the patient was initiated on Aspercreme topically and improved clinically. Patient was evaluated by physical therapist and recommended SNF placement. Echocardiogram revealed ejection fraction of 25-30%%, diffuse hypokinesis , cardiologist saw patient in consultation and recommended medical therapy only. The patient was felty to be stable to be discharged to SNF today. Recommending palliative care follow up as outpatient.  Discussion by problem: #1. Acute respiratory failure with hypoxia due to pulmonary embolism,. Now off oxygen, O2 sats ar good on room air, follow closely on exertion and initiate Oxygen supplementation if needed #2 pulmonary embolism , acute, continue Eliquis loading for the next 6 days, then maintenance therapy as long as the patient is sedentary following platelet count closely as outpatient. Palliative care follow up is recommended in the facility.   #3. Metabolic acidosis, resolved with therapy #4. Chronic renal insufficiency,back to baseline with IVF administration, creatinine 1.4 in March 2017, 1.34 11/13/2016. Urinalysis was unremarkable, no obvious urinary tract  infection #5. Hypokalemia, resolved #6. Elevated troponin, likely demand ischemia, echocardiogram is markedly abnormal with a decreased ejection fraction of 25-30%,  global hypokinesis,  cardiologist saw patient in consultation, medical therapy was recommended, palliative care input . Continue patient on Coreg, lisinopril  #7. Leukocytosis, likely stress related,improving,  follow as outpatient.   #8. thrombocytopenia, likely consumption, follow closely as outpatient #9 left knee effusion, orthopedic surgeon is suspecting hemarthrosis, cold compresses were recommended by ortho,  supportive therapy, Aspercreme, continue physical therapy , weight bearing as  tolerated #10. Cardiomyopathy with ejection fraction and in 25-30%, continue Coreg, continue ACE inhibitor, appreciate cardiologist recommendations, get palliative care involved as well as outpatient    VITAL SIGNS:  Blood pressure (!) 134/58, pulse 91, temperature 99.3 F (37.4 C), temperature source Oral, resp. rate 18, height 5\' 6"  (1.676 m), weight 72.6 kg (160 lb), SpO2 96 %.  I/O:   Intake/Output Summary (Last 24 hours) at 11/14/16 0939 Last data filed at 11/14/16 0525  Gross per 24 hour  Intake                0 ml  Output              500 ml  Net             -500 ml    PHYSICAL EXAMINATION:  GENERAL:  82 y.o.-year-old patient lying in the bed with no acute distress.  EYES: Pupils equal, round, reactive to light and accommodation. No scleral icterus. Extraocular muscles intact.  HEENT: Head atraumatic, normocephalic. Oropharynx and nasopharynx clear.  NECK:  Supple, no jugular venous distention. No thyroid enlargement, no tenderness.  LUNGS: Normal breath sounds bilaterally, no wheezing, rales,rhonchi or crepitation. No use of accessory muscles of respiration.  CARDIOVASCULAR: S1, S2 normal. No murmurs, rubs, or gallops.  ABDOMEN: Soft, non-tender, non-distended. Bowel sounds present. No organomegaly or mass.  EXTREMITIES: No pedal edema, cyanosis, or  clubbing. Left knee effusion, restricted ROM NEUROLOGIC: Cranial nerves II through XII are intact. Muscle strength 5/5 in all extremities. Sensation intact. Gait not checked.  PSYCHIATRIC: The patient is alert and oriented x 3.  SKIN: No obvious rash, lesion, or ulcer.   DATA REVIEW:   CBC  Recent Labs Lab 11/14/16 0357  WBC 13.5*  HGB 9.5*  HCT 27.7*  PLT 121*    Chemistries   Recent Labs Lab 11/11/16 0851 11/12/16 0448 11/13/16 0453  NA 140 140  --   K 3.4* 3.9  --   CL 111 108  --   CO2 17* 24  --   GLUCOSE 235* 151*  --   BUN 20 29*  --   CREATININE 1.36* 1.64* 1.34*  CALCIUM 8.2* 8.5*  --   AST 37   --   --   ALT 29  --   --   ALKPHOS 88  --   --   BILITOT 0.8  --   --     Cardiac Enzymes  Recent Labs Lab 11/12/16 0030  TROPONINI 0.92*    Microbiology Results  Results for orders placed or performed during the hospital encounter of 11/11/16  MRSA PCR Screening     Status: None   Collection Time: 11/11/16  6:41 PM  Result Value Ref Range Status   MRSA by PCR NEGATIVE NEGATIVE Final    Comment:        The GeneXpert MRSA Assay (FDA approved for NASAL specimens only), is one component of a comprehensive MRSA colonization surveillance program. It is not intended to diagnose MRSA infection nor to guide or monitor treatment for MRSA infections.     RADIOLOGY:  Dg Pelvis Portable  Result Date: 11/12/2016 CLINICAL DATA:  Bilateral hip pain EXAM: PORTABLE PELVIS 1-2 VIEWS COMPARISON:  None. FINDINGS: Negative for fracture or mass. Hip joint normal bilaterally. Contrast in the urinary bladder CT chest with contrast yesterday. IMPRESSION: Negative. Electronically Signed   By: Marlan Palau M.D.   On: 11/12/2016 14:29   US Venous Img Lower Bilateral  Result Date: 11/12/2016 CLINICAL DATA:  Pulmonary emboli.  Evaluate for lower extremity DVT. EXAM: BILATERAL LOWER EXTREMITY VENOUS DOPPLER ULTRASOUND TECHNIQUE: Gray-scale sonography with graded compression, as well as color Doppler and duplex ultrasound were performed to evaluate the lower extremity deep venous systems from the level of the common femoral vein and including the common femoral, femoral, profunda femoral, popliteal and calf veins including the posterior tibial, peroneal and gastrocnemius veins when visible. The superficial great saphenous vein was also interrogated. Spectral Doppler was utilized to evaluate flow at rest and with distal augmentation maneuvers in the common femoral, femoral and popliteal veins. COMPARISON:  None. FINDINGS: RIGHT LOWER EXTREMITY Common Femoral Vein: No evidence of thrombus. Normal  compressibility, respiratory phasicity and response to augmentation. Saphenofemoral Junction: No evidence of thrombus. Normal compressibility and flow on color Doppler imaging. Profunda Femoral Vein: No evidence of thrombus. Normal compressibility and flow on color Doppler imaging. Femoral Vein: No evidence of thrombus. Normal compressibility, respiratory phasicity and response to augmentation. Popliteal Vein: No evidence of thrombus. Normal compressibility, respiratory phasicity and response to augmentation. Calf Veins: No evidence of thrombus. Normal compressibility and flow on color Doppler imaging. LEFT LOWER EXTREMITY Common Femoral Vein: No evidence of thrombus. Normal compressibility, respiratory phasicity and response to augmentation. Saphenofemoral Junction: No evidence of thrombus. Normal compressibility and flow on color Doppler imaging. Profunda Femoral Vein: No evidence of thrombus. Normal compressibility and flow on  color Doppler imaging. Femoral Vein: No evidence of thrombus. Normal compressibility, respiratory phasicity and response to augmentation. Popliteal Vein: No evidence of thrombus. Normal compressibility, respiratory phasicity and response to augmentation. Calf Veins: No evidence of thrombus. Normal compressibility and flow on color Doppler imaging. IMPRESSION: No evidence of deep venous thrombosis. Electronically Signed   By: Richarda OverlieAdam  Henn M.D.   On: 11/12/2016 10:22   Dg Knee 3 Views Left  Result Date: 11/13/2016 CLINICAL DATA:  Left knee pain and swelling. EXAM: LEFT KNEE - 3 VIEW COMPARISON:  None. FINDINGS: Large joint effusion without acute fracture or subluxation. There may be a small intra-articular ossified body in the suprapatellar joint space. No erosion. Tricompartmental osteoarthritis with diffuse marginal spurring and joint narrowing. Osteopenia. IMPRESSION: 1. Large joint effusion without acute osseous finding. 2. Tricompartmental osteoarthritis. Electronically Signed   By:  Marnee SpringJonathon  Watts M.D.   On: 11/13/2016 10:42    EKG:   Orders placed or performed during the hospital encounter of 11/11/16  . ED EKG  . ED EKG  . EKG 12-Lead  . EKG 12-Lead      Management plans discussed with the patient, family and they are in agreement.  CODE STATUS:     Code Status Orders        Start     Ordered   11/11/16 1213  Full code  Continuous     11/11/16 1212    Code Status History    Date Active Date Inactive Code Status Order ID Comments User Context   This patient has a current code status but no historical code status.      TOTAL TIME TAKING CARE OF THIS PATIENT: 40 minutes.    Katharina CaperVAICKUTE,Tacie Mccuistion M.D on 11/14/2016 at 9:39 AM  Between 7am to 6pm - Pager - [phone]  After 6pm go to [email or web address] - password [email or web address] [facility]  [facility]  Office  [phone]  CC: Primary care physician; Leotis ShamesSingh,Jasmine, MD

## 2017-05-08 ENCOUNTER — Emergency Department: Payer: Medicare Other

## 2017-05-08 ENCOUNTER — Observation Stay
Admission: EM | Admit: 2017-05-08 | Discharge: 2017-05-09 | Disposition: A | Discharge status: Home / Self Care | Payer: Medicare Other | Attending: Internal Medicine | Admitting: Internal Medicine

## 2017-05-08 DIAGNOSIS — I429 Cardiomyopathy, unspecified: Secondary | ICD-10-CM | POA: Insufficient documentation

## 2017-05-08 DIAGNOSIS — I6523 Occlusion and stenosis of bilateral carotid arteries: Secondary | ICD-10-CM | POA: Diagnosis not present

## 2017-05-08 DIAGNOSIS — I129 Hypertensive chronic kidney disease with stage 1 through stage 4 chronic kidney disease, or unspecified chronic kidney disease: Secondary | ICD-10-CM | POA: Diagnosis not present

## 2017-05-08 DIAGNOSIS — R778 Other specified abnormalities of plasma proteins: Secondary | ICD-10-CM | POA: Insufficient documentation

## 2017-05-08 DIAGNOSIS — D72829 Elevated white blood cell count, unspecified: Secondary | ICD-10-CM | POA: Insufficient documentation

## 2017-05-08 DIAGNOSIS — D696 Thrombocytopenia, unspecified: Secondary | ICD-10-CM | POA: Diagnosis not present

## 2017-05-08 DIAGNOSIS — Z79899 Other long term (current) drug therapy: Secondary | ICD-10-CM | POA: Diagnosis not present

## 2017-05-08 DIAGNOSIS — E872 Acidosis: Secondary | ICD-10-CM | POA: Diagnosis not present

## 2017-05-08 DIAGNOSIS — E7800 Pure hypercholesterolemia, unspecified: Secondary | ICD-10-CM | POA: Diagnosis not present

## 2017-05-08 DIAGNOSIS — M79604 Pain in right leg: Secondary | ICD-10-CM | POA: Insufficient documentation

## 2017-05-08 DIAGNOSIS — F0390 Unspecified dementia without behavioral disturbance: Secondary | ICD-10-CM | POA: Diagnosis not present

## 2017-05-08 DIAGNOSIS — Z7901 Long term (current) use of anticoagulants: Secondary | ICD-10-CM | POA: Insufficient documentation

## 2017-05-08 DIAGNOSIS — E1122 Type 2 diabetes mellitus with diabetic chronic kidney disease: Secondary | ICD-10-CM | POA: Diagnosis not present

## 2017-05-08 DIAGNOSIS — Z8673 Personal history of transient ischemic attack (TIA), and cerebral infarction without residual deficits: Secondary | ICD-10-CM | POA: Insufficient documentation

## 2017-05-08 DIAGNOSIS — E876 Hypokalemia: Secondary | ICD-10-CM | POA: Insufficient documentation

## 2017-05-08 DIAGNOSIS — R55 Syncope and collapse: Secondary | ICD-10-CM | POA: Diagnosis not present

## 2017-05-08 DIAGNOSIS — Z86711 Personal history of pulmonary embolism: Secondary | ICD-10-CM | POA: Diagnosis not present

## 2017-05-08 DIAGNOSIS — N183 Chronic kidney disease, stage 3 unspecified: Secondary | ICD-10-CM | POA: Diagnosis present

## 2017-05-08 DIAGNOSIS — I10 Essential (primary) hypertension: Secondary | ICD-10-CM

## 2017-05-08 DIAGNOSIS — E1151 Type 2 diabetes mellitus with diabetic peripheral angiopathy without gangrene: Secondary | ICD-10-CM | POA: Insufficient documentation

## 2017-05-08 LAB — CBC
HCT: 37.1 % (ref 35.0–47.0)
HEMOGLOBIN: 12.3 g/dL (ref 12.0–16.0)
MCH: 29.8 pg (ref 26.0–34.0)
MCHC: 33.3 g/dL (ref 32.0–36.0)
MCV: 89.5 fL (ref 80.0–100.0)
PLATELETS: 205 10*3/uL (ref 150–440)
RBC: 4.15 MIL/uL (ref 3.80–5.20)
RDW: 15.8 % — ABNORMAL HIGH (ref 11.5–14.5)
WBC: 9.7 10*3/uL (ref 3.6–11.0)

## 2017-05-08 LAB — URINALYSIS, COMPLETE (UACMP) WITH MICROSCOPIC
Bilirubin Urine: NEGATIVE
GLUCOSE, UA: NEGATIVE mg/dL
HGB URINE DIPSTICK: NEGATIVE
KETONES UR: NEGATIVE mg/dL
LEUKOCYTES UA: NEGATIVE
NITRITE: NEGATIVE
PH: 6 (ref 5.0–8.0)
PROTEIN: 30 mg/dL — AB
Specific Gravity, Urine: 1.019 (ref 1.005–1.030)

## 2017-05-08 LAB — BASIC METABOLIC PANEL
ANION GAP: 8 (ref 5–15)
BUN: 24 mg/dL — ABNORMAL HIGH (ref 6–20)
CALCIUM: 9.2 mg/dL (ref 8.9–10.3)
CO2: 27 mmol/L (ref 22–32)
CREATININE: 1.43 mg/dL — AB (ref 0.44–1.00)
Chloride: 104 mmol/L (ref 101–111)
GFR, EST AFRICAN AMERICAN: 38 mL/min — AB (ref >60)
GFR, EST NON AFRICAN AMERICAN: 33 mL/min — AB (ref >60)
Glucose, Bld: 128 mg/dL — ABNORMAL HIGH (ref 65–99)
Potassium: 4.3 mmol/L (ref 3.5–5.1)
SODIUM: 139 mmol/L (ref 135–145)

## 2017-05-08 LAB — TROPONIN I: Troponin I: <0.03 ng/mL (ref <0.03)

## 2017-05-08 LAB — GLUCOSE, CAPILLARY: Glucose-Capillary: 131 mg/dL — ABNORMAL HIGH (ref 65–99)

## 2017-05-08 LAB — MRSA PCR SCREENING: MRSA BY PCR: NEGATIVE

## 2017-05-08 MED ORDER — ONDANSETRON HCL 4 MG PO TABS: 4.0000 mg | ORAL_TABLET | Freq: Four times a day (QID) | ORAL | Status: DC | PRN

## 2017-05-08 MED ORDER — ACETAMINOPHEN 650 MG RE SUPP: 650.0000 mg | Freq: Four times a day (QID) | RECTAL | Status: DC | PRN

## 2017-05-08 MED ORDER — CARVEDILOL 6.25 MG PO TABS
3.1250 mg | ORAL_TABLET | Freq: Two times a day (BID) | ORAL | Status: DC
  Administered 2017-05-08: 3.125 mg via ORAL
  Filled 2017-05-08 (×2): qty 1

## 2017-05-08 MED ORDER — DOCUSATE SODIUM 100 MG PO CAPS
100.0000 mg | ORAL_CAPSULE | Freq: Two times a day (BID) | ORAL | Status: DC
Start: 2017-05-08 — End: 2017-05-09
  Administered 2017-05-08: 100 mg via ORAL
  Filled 2017-05-08: qty 1

## 2017-05-08 MED ORDER — APIXABAN 5 MG PO TABS
5.0000 mg | ORAL_TABLET | Freq: Two times a day (BID) | ORAL | Status: DC
  Administered 2017-05-08: 5 mg via ORAL
  Filled 2017-05-08: qty 1

## 2017-05-08 MED ORDER — DORZOLAMIDE HCL-TIMOLOL MAL 2-0.5 % OP SOLN
1.0000 [drp] | Freq: Two times a day (BID) | OPHTHALMIC | Status: DC
  Administered 2017-05-08: 1 [drp] via OPHTHALMIC
  Filled 2017-05-08: qty 10

## 2017-05-08 MED ORDER — SODIUM CHLORIDE 0.9 % IV SOLN
INTRAVENOUS | Status: DC
  Administered 2017-05-08: 18:00:00 via INTRAVENOUS

## 2017-05-08 MED ORDER — BISACODYL 10 MG RE SUPP: 10.0000 mg | Freq: Every day | RECTAL | Status: DC | PRN

## 2017-05-08 MED ORDER — LISINOPRIL 5 MG PO TABS: 5.0000 mg | ORAL_TABLET | Freq: Every day | ORAL | Status: DC

## 2017-05-08 MED ORDER — VITAMIN B-12 1000 MCG PO TABS: 1000.0000 ug | ORAL_TABLET | Freq: Every day | ORAL | Status: DC

## 2017-05-08 MED ORDER — LATANOPROST 0.005 % OP SOLN
1.0000 [drp] | Freq: Every day | OPHTHALMIC | Status: DC
  Administered 2017-05-08: 1 [drp] via OPHTHALMIC
  Filled 2017-05-08: qty 2.5

## 2017-05-08 MED ORDER — ENOXAPARIN SODIUM 30 MG/0.3ML ~~LOC~~ SOLN: 30.0000 mg | SUBCUTANEOUS | Status: DC

## 2017-05-08 MED ORDER — ACETAMINOPHEN 325 MG PO TABS
650.0000 mg | ORAL_TABLET | Freq: Four times a day (QID) | ORAL | Status: DC | PRN
Start: 2017-05-08 — End: 2017-05-09

## 2017-05-08 MED ORDER — ADULT MULTIVITAMIN W/MINERALS CH: 1.0000 | ORAL_TABLET | Freq: Every day | ORAL | Status: DC

## 2017-05-08 MED ORDER — ONDANSETRON HCL 4 MG/2ML IJ SOLN: 4.0000 mg | Freq: Four times a day (QID) | INTRAMUSCULAR | Status: DC | PRN

## 2017-05-08 MED ORDER — LORAZEPAM 0.5 MG PO TABS: 0.5000 mg | ORAL_TABLET | Freq: Every day | ORAL | Status: DC | PRN

## 2017-05-08 MED ORDER — ATORVASTATIN CALCIUM 10 MG PO TABS: 10.0000 mg | ORAL_TABLET | Freq: Every day | ORAL | Status: DC

## 2017-05-08 NOTE — ED Notes (Signed)
Report called to nurse in 209

## 2017-05-08 NOTE — H&P (Signed)
History and Physical    Theresa Askewverlean Liew UJW:[id number] DOB: 05/15/1931 DOA: 05/08/2017  Referring physician: Dr. Shaune PollackLord PCP: Leotis ShamesSingh, Jasmine, MD  Specialists: none  Chief Complaint: syncope  HPI: Theresa Santos is a 83 y.o. female has a past medical history significant for HTN, DM, dementia, and CKD now with syncopal episode witnessed by daughter. ER eval including CT negative. Pt is without complaints. No fever. Denies CP or SOB. No palpitations. She is now admitted. No hx of syncope  Review of Systems: The patient denies anorexia, fever, weight loss,, vision loss, decreased hearing, hoarseness, chest pain, syncope, dyspnea on exertion, peripheral edema, balance deficits, hemoptysis, abdominal pain, melena, hematochezia, severe indigestion/heartburn, hematuria, incontinence, genital sores, muscle weakness, suspicious skin lesions, transient blindness, difficulty walking, depression, unusual weight change, abnormal bleeding, enlarged lymph nodes, angioedema, and breast masses.   Past Medical History:  Diagnosis Date  . Chronic kidney disease   . Diabetes mellitus without complication (HCC)   . High cholesterol   . Hypertension   . TIA (transient ischemic attack)   . Vascular dementia   . Vitamin B12 deficiency    Past Surgical History:  Procedure Laterality Date  . Left knee surgery     > 20 years ago   Social History:  reports that she has never smoked. She has never used smokeless tobacco. She reports that she does not drink alcohol or use drugs.  No Known Allergies  History reviewed. No pertinent family history.  Prior to Admission medications   Medication Sig Start Date End Date Taking? Authorizing Provider  apixaban (ELIQUIS) 5 MG TABS tablet Take 1 tablet (5 mg total) by mouth 2 (two) times daily. 11/20/16  Yes Katharina CaperVaickute, Rima, MD  atorvastatin (LIPITOR) 10 MG tablet Take 10 mg by mouth daily.   Yes [provider]  carvedilol (COREG) 3.125 MG tablet Take 1  tablet (3.125 mg total) by mouth 2 (two) times daily with a meal. 11/14/16  Yes Katharina CaperVaickute, Rima, MD  dorzolamide-timolol (COSOPT) 22.3-6.8 MG/ML ophthalmic solution Place 1 drop into both eyes 2 (two) times daily.   Yes [provider]  latanoprost (XALATAN) 0.005 % ophthalmic solution Place 1 drop into both eyes at bedtime.   Yes [provider]  lisinopril (PRINIVIL,ZESTRIL) 5 MG tablet Take 5 mg by mouth daily.   Yes [provider]  LORazepam (ATIVAN) 0.5 MG tablet Take 1 tablet (0.5 mg total) by mouth daily as needed for anxiety (agitation/sundowning). 11/13/16  Yes Katharina CaperVaickute, Rima, MD  Multiple Vitamin (THEREMS) TABS Take 1 tablet by mouth daily.   Yes [provider]  trolamine salicylate (ASPERCREME) 10 % cream Apply topically 2 (two) times daily. Patient taking differently: Apply 1 application topically 2 (two) times daily as needed. To knees 11/14/16  Yes Katharina CaperVaickute, Rima, MD  vitamin B-12 (CYANOCOBALAMIN) 1000 MCG tablet Take 1,000 mcg by mouth daily.   Yes [provider]  apixaban (ELIQUIS) 5 MG TABS tablet Take 2 tablets (10 mg total) by mouth 2 (two) times daily. Patient not taking: Reported on 05/08/2017 11/14/16   Katharina CaperVaickute, Rima, MD  HYDROcodone-acetaminophen (NORCO/VICODIN) 5-325 MG tablet Take 1 tablet by mouth every 4 (four) hours as needed for moderate pain. Patient not taking: Reported on 05/08/2017 11/13/16   Katharina CaperVaickute, Rima, MD   Physical Exam: Vitals:   05/08/17 1300 05/08/17 1330 05/08/17 1430 05/08/17 1500  BP: (!) 156/75 (!) 159/72 (!) 154/78 (!) 97/56  Pulse: 76     Resp: (!) 21 16 20 18   Temp:  TempSrc:      SpO2: 96%     Weight:         General:  No apparent distress, WDWN, Ashley/AT  Eyes: PERRL, EOMI, no scleral icterus, conjunctiva clear  ENT: moist oropharynx without exudate, TM's benign, dentition good  Neck: supple, no lymphadenopathy. No bruits or thyromegaly  Cardiovascular: regular rate without MRG; 2+  peripheral pulses, no JVD, no peripheral edema  Respiratory: CTA biL, good air movement without wheezing, rhonchi or crackled. Respiratory effort normal  Abdomen: soft, non tender to palpation, positive bowel sounds, no guarding, no rebound  Skin: no rashes or lesions  Musculoskeletal: normal bulk and tone, no joint swelling  Psychiatric: normal mood and affect, A&OX3  Neurologic: CN 2-12 grossly intact, Motor strength 5/5 in all 4 groups with symmetric DTR's and non-focal sensory exam  Labs on Admission:  Basic Metabolic Panel:  Recent Labs Lab 05/08/17 1223  NA 139  K 4.3  CL 104  CO2 27  GLUCOSE 128*  BUN 24*  CREATININE 1.43*  CALCIUM 9.2   Liver Function Tests: No results for input(s): AST, ALT, ALKPHOS, BILITOT, PROT, ALBUMIN in the last 168 hours. No results for input(s): LIPASE, AMYLASE in the last 168 hours. No results for input(s): AMMONIA in the last 168 hours. CBC:  Recent Labs Lab 05/08/17 1223  WBC 9.7  HGB 12.3  HCT 37.1  MCV 89.5  PLT 205   Cardiac Enzymes:  Recent Labs Lab 05/08/17 1223  TROPONINI <0.03    BNP (last 3 results) No results for input(s): BNP in the last 8760 hours.  ProBNP (last 3 results) No results for input(s): PROBNP in the last 8760 hours.  CBG:  Recent Labs Lab 05/08/17 1321  GLUCAP 131*    Radiological Exams on Admission: Ct Head Wo Contrast  Result Date: 05/08/2017 CLINICAL DATA:  Syncope. EXAM: CT HEAD WITHOUT CONTRAST TECHNIQUE: Contiguous axial images were obtained from the base of the skull through the vertex without intravenous contrast. COMPARISON:  11/15/2014 FINDINGS: Brain: Stable diffuse cortical atrophy. Stable moderately advanced small vessel disease of the periventricular white matter. The brain demonstrates no evidence of hemorrhage, acute infarction, edema, mass effect, extra-axial fluid collection, hydrocephalus or mass lesion. Vascular: No hyperdense vessel or unexpected calcification. Skull:  Normal. Negative for fracture or focal lesion. Sinuses/Orbits: No acute finding. Other: None. IMPRESSION: Stable atrophy and small vessel disease.  No acute findings. Electronically Signed   By: Irish Lack M.D.   On: 05/08/2017 14:04    EKG: Independently reviewed.  Assessment/Plan Principal Problem:   Syncope Active Problems:   CKD (chronic kidney disease), stage III   Thrombocytopenia (HCC)   HTN, goal below 140/80   Will observe with telemetry and follow cardiac enzymes. Order echo, carotid US , and MRI of brain. Neuro checks. IV fluids. Repeat labs in AM. Consult PT and CM  Diet: low carb Fluids: NS@75  DVT Prophylaxis: Lovenox  Code Status: FULL  Family Communication: yes  Disposition Plan: ALF  Time spent: 50 min

## 2017-05-08 NOTE — ED Triage Notes (Signed)
Pt came to ED via EMS. Pt lives at assisted living but was at family's house visitin. Daughter was doing pts hair, pt was sitting under dryer when pt has syncopal episode. Denies fall/injury associated with episode. Pt reports feeling lightheaded before syncopal episode, denies currently. VS stable.

## 2017-05-08 NOTE — ED Provider Notes (Signed)
[facility] Emergency Department Provider Note ____________________________________________   I have reviewed the triage vital signs and the triage nursing note.  HISTORY  Chief Complaint Loss of Consciousness   Historian Patient limited historian due to dementia Daughter as well as son-in-law and grandson provides the history  HPI Theresa Santos is a 83 y.o. female lives at assisted living was at home with her daughter today getting her hair done in preparation for Mother's Day tomorrow. Patient had just had her hair washed and was sitting under a hair dryer for about 15 minutes while her daughter was doing a pedicure on her. She felt like she needed to the bathroom and was going to get up and then slumped over. No seizure activity. She was not responsive for several minutes and daughter called 911. Patient did come back around and immediately picked up where she left off, asking to go to the bathroom. Total episode lasted probably less than 5-10 minutes.  No chest pain. No fevers. She did vomit once when she came right back around. There is no muscular jerking activity. No urinary symptoms.  There is no traumatic injury.  She is back to current mental status baseline.      Past Medical History:  Diagnosis Date  . Chronic kidney disease   . Diabetes mellitus without complication (HCC)   . High cholesterol   . Hypertension   . TIA (transient ischemic attack)   . Vascular dementia   . Vitamin B12 deficiency     Patient Active Problem List   Diagnosis Date Noted  . Knee effusion, left 11/14/2016  . Hemarthrosis 11/14/2016  . Cardiomyopathy (HCC) 11/14/2016  . Acute pulmonary embolism (HCC) 11/13/2016  . Metabolic acidosis 11/13/2016  . CKD (chronic kidney disease), stage III 11/13/2016  . Hypokalemia 11/13/2016  . Elevated troponin 11/13/2016  . Leukocytosis 11/13/2016  . Thrombocytopenia (HCC) 11/13/2016  . Right leg pain 11/13/2016  . Fall  11/13/2016  . Acute respiratory failure with hypoxia (HCC) 11/11/2016    Past Surgical History:  Procedure Laterality Date  . Left knee surgery     > 20 years ago    Prior to Admission medications   Medication Sig Start Date End Date Taking? Authorizing Provider  apixaban (ELIQUIS) 5 MG TABS tablet Take 2 tablets (10 mg total) by mouth 2 (two) times daily. 11/14/16   Katharina CaperVaickute, Rima, MD  apixaban (ELIQUIS) 5 MG TABS tablet Take 1 tablet (5 mg total) by mouth 2 (two) times daily. 11/20/16   Katharina CaperVaickute, Rima, MD  atorvastatin (LIPITOR) 10 MG tablet Take 10 mg by mouth daily.    [provider]  carvedilol (COREG) 3.125 MG tablet Take 1 tablet (3.125 mg total) by mouth 2 (two) times daily with a meal. 11/14/16   Katharina CaperVaickute, Rima, MD  dorzolamide-timolol (COSOPT) 22.3-6.8 MG/ML ophthalmic solution Place 1 drop into both eyes 2 (two) times daily.    [provider]  HYDROcodone-acetaminophen (NORCO/VICODIN) 5-325 MG tablet Take 1 tablet by mouth every 4 (four) hours as needed for moderate pain. 11/13/16   Katharina CaperVaickute, Rima, MD  latanoprost (XALATAN) 0.005 % ophthalmic solution Place 1 drop into both eyes at bedtime.    [provider]  lisinopril (PRINIVIL,ZESTRIL) 5 MG tablet Take 5 mg by mouth daily.    [provider]  LORazepam (ATIVAN) 0.5 MG tablet Take 1 tablet (0.5 mg total) by mouth daily as needed for anxiety (agitation/sundowning). 11/13/16   Katharina CaperVaickute, Rima, MD  Multiple Vitamin (THEREMS) TABS Take  1 tablet by mouth daily.    [provider]  trolamine salicylate (ASPERCREME) 10 % cream Apply topically 2 (two) times daily. 11/14/16   Katharina Caper, MD  vitamin B-12 (CYANOCOBALAMIN) 1000 MCG tablet Take 1,000 mcg by mouth daily.    [provider]    No Known Allergies  No family history on file.  Social History Social History  Substance Use Topics  . Smoking status: Never Smoker  . Smokeless tobacco: Never Used  . Alcohol use No     Review of Systems  Constitutional: Negative for fever. Eyes: Negative for visual changes. ENT: Negative for sore throat. Cardiovascular: Negative for chest pain. Respiratory: Negative for shortness of breath. Gastrointestinal: Negative for abdominal pain, vomiting and diarrhea. Genitourinary: Negative for dysuria. Musculoskeletal: Negative for back pain. Skin: Negative for rash. Neurological: Negative for headache. 10 point Review of Systems otherwise negative ____________________________________________   PHYSICAL EXAM:  VITAL SIGNS: ED Triage Vitals  Enc Vitals Group     BP 05/08/17 1243 (!) 150/84     Pulse Rate 05/08/17 1243 74     Resp 05/08/17 1243 16     Temp 05/08/17 1243 98.3 F (36.8 C)     Temp Source 05/08/17 1243 Oral     SpO2 05/08/17 1243 98 %     Weight 05/08/17 1221 140 lb (63.5 kg)     Height --      Head Circumference --      Peak Flow --      Pain Score --      Pain Loc --      Pain Edu? --      Excl. in GC? --      Constitutional: Alert and Cooperative and pleasant. Well appearing and in no distress. HEENT   Head: Normocephalic and atraumatic.      Eyes: Conjunctivae are normal. PERRL. Normal extraocular movements.      Ears:         Nose: No congestion/rhinnorhea.   Mouth/Throat: Mucous membranes are moist.   Neck: No stridor. No C-spine tenderness. Cardiovascular/Chest: Normal rate, regular rhythm.  No murmurs, rubs, or gallops. Respiratory: Normal respiratory effort without tachypnea nor retractions. Breath sounds are clear and equal bilaterally. No wheezes/rales/rhonchi. Gastrointestinal: Soft. No distention, no guarding, no rebound. Nontender.    Genitourinary/rectal:Deferred Musculoskeletal: Nontender with normal range of motion in all extremities. No joint effusions.  No lower extremity tenderness.  No edema. Neurologic:  No slurred speech. Normal speech and language. No gross or focal neurologic deficits are  appreciated. Skin:  Skin is warm, dry and intact. No rash noted. Psychiatric: No agitation.   ____________________________________________  LABS (pertinent positives/negatives)  Labs Reviewed  BASIC METABOLIC PANEL - Abnormal; Notable for the following:       Result Value   Glucose, Bld 128 (*)    BUN 24 (*)    Creatinine, Ser 1.43 (*)    GFR calc non Af Amer 33 (*)    GFR calc Af Amer 38 (*)    All other components within normal limits  CBC - Abnormal; Notable for the following:    RDW 15.8 (*)    All other components within normal limits  URINALYSIS, COMPLETE (UACMP) WITH MICROSCOPIC - Abnormal; Notable for the following:    Color, Urine YELLOW (*)    APPearance HAZY (*)    Protein, ur 30 (*)    Bacteria, UA MANY (*)    Squamous Epithelial / LPF 0-5 (*)  All other components within normal limits  GLUCOSE, CAPILLARY - Abnormal; Notable for the following:    Glucose-Capillary 131 (*)    All other components within normal limits  URINE CULTURE  TROPONIN I  CBG MONITORING, ED    ____________________________________________    EKG I, Governor Rooks, MD, the attending physician have personally viewed and interpreted all ECGs.  70 bpm. Normal sinus rhythm. Narrow QRS. Normal axis. Nonspecific ST and T-wave. ____________________________________________  RADIOLOGY All Xrays were viewed by me. Imaging interpreted by Radiologist.  CT head without contrast: IMPRESSION: Stable atrophy and small vessel disease.  No acute findings. __________________________________________  PROCEDURES  Procedure(s) performed: None  Critical Care performed: None  ____________________________________________   ED COURSE / ASSESSMENT AND PLAN  Pertinent labs & imaging results that were available during my care of the patient were reviewed by me and considered in my medical decision making (see chart for details).   Ms. Sanborn had a syncopal episode while sitting, uncertain  etiology. It sounds like it lasted about possibly 10 minutes. Given the unusual situation here, I think that she does need observation and telemetry monitoring for this episode of syncope.  Reassuring evaluation overall. No acute anemia. No acute renal failure. She does have evidence of possible UTI, but without symptoms, I am going to send a culture. Head CT without acute findings.  EKG nonspecific.    CONSULTATIONS:   Hospitalist for observation admission for syncope.   Patient / Family / Caregiver informed of clinical course, medical decision-making process, and agree with plan.    ___________________________________________   FINAL CLINICAL IMPRESSION(S) / ED DIAGNOSES   Final diagnoses:  Syncope, unspecified syncope type              Note: This dictation was prepared with Dragon dictation. Any transcriptional errors that result from this process are unintentional    Governor Rooks, MD 05/08/17 1527

## 2017-05-08 NOTE — ED Notes (Signed)
Dr. Lord at bedside.  

## 2017-05-09 DIAGNOSIS — R55 Syncope and collapse: Secondary | ICD-10-CM | POA: Diagnosis not present

## 2017-05-09 LAB — COMPREHENSIVE METABOLIC PANEL
ALBUMIN: 3.4 g/dL — AB (ref 3.5–5.0)
ALK PHOS: 88 U/L (ref 38–126)
ALT: 25 U/L (ref 14–54)
AST: 26 U/L (ref 15–41)
Anion gap: 6 (ref 5–15)
BUN: 26 mg/dL — ABNORMAL HIGH (ref 6–20)
CALCIUM: 8.6 mg/dL — AB (ref 8.9–10.3)
CO2: 23 mmol/L (ref 22–32)
Chloride: 109 mmol/L (ref 101–111)
Creatinine, Ser: 1.28 mg/dL — ABNORMAL HIGH (ref 0.44–1.00)
GFR calc Af Amer: 44 mL/min — ABNORMAL LOW (ref >60)
GFR, EST NON AFRICAN AMERICAN: 38 mL/min — AB (ref >60)
GLUCOSE: 140 mg/dL — AB (ref 65–99)
POTASSIUM: 4.1 mmol/L (ref 3.5–5.1)
Sodium: 138 mmol/L (ref 135–145)
Total Bilirubin: 0.8 mg/dL (ref 0.3–1.2)
Total Protein: 6.5 g/dL (ref 6.5–8.1)

## 2017-05-09 LAB — CBC
HCT: 34.5 % — ABNORMAL LOW (ref 35.0–47.0)
HEMOGLOBIN: 11.7 g/dL — AB (ref 12.0–16.0)
MCH: 30.1 pg (ref 26.0–34.0)
MCHC: 33.8 g/dL (ref 32.0–36.0)
MCV: 89.1 fL (ref 80.0–100.0)
PLATELETS: 190 10*3/uL (ref 150–440)
RBC: 3.87 MIL/uL (ref 3.80–5.20)
RDW: 15.5 % — ABNORMAL HIGH (ref 11.5–14.5)
WBC: 7.9 10*3/uL (ref 3.6–11.0)

## 2017-05-09 LAB — TROPONIN I: Troponin I: <0.03 ng/mL (ref <0.03)

## 2017-05-09 LAB — URINE CULTURE

## 2017-05-09 LAB — GLUCOSE, CAPILLARY: GLUCOSE-CAPILLARY: 129 mg/dL — AB (ref 65–99)

## 2017-05-09 MED ORDER — LORAZEPAM 2 MG/ML IJ SOLN
0.5000 mg | Freq: Once | INTRAMUSCULAR | Status: AC
Start: 2017-05-09 — End: 2017-05-09
  Administered 2017-05-09: 0.5 mg via INTRAVENOUS
  Filled 2017-05-09: qty 1

## 2017-05-09 NOTE — NC FL2 (Signed)
[place] MEDICAID FL2 LEVEL OF CARE SCREENING TOOL     IDENTIFICATION  Patient Name: Theresa Santos Birthdate: 05/15/1931 Sex: female Admission Date (Current Location): 05/08/2017  [facility] [place] Number:  Chiropodist and Address:  [facility], [address], [place], [place] [address]      Provider Number: [id number]  Attending Physician Name and Address:  Enedina Finner, MD  Relative Name and Phone Number:       Current Level of Care: Domiciliary (Rest home) Recommended Level of Care: Assisted Living Facility Prior Approval Number:    Date Approved/Denied:   PASRR Number:    Discharge Plan: Domiciliary (Rest home)    Current Diagnoses: Patient Active Problem List   Diagnosis Date Noted  . Syncope 05/08/2017  . HTN, goal below 140/80 05/08/2017  . Knee effusion, left 11/14/2016  . Hemarthrosis 11/14/2016  . Cardiomyopathy (HCC) 11/14/2016  . Acute pulmonary embolism (HCC) 11/13/2016  . Metabolic acidosis 11/13/2016  . CKD (chronic kidney disease), stage III 11/13/2016  . Hypokalemia 11/13/2016  . Elevated troponin 11/13/2016  . Leukocytosis 11/13/2016  . Thrombocytopenia (HCC) 11/13/2016  . Right leg pain 11/13/2016  . Fall 11/13/2016  . Acute respiratory failure with hypoxia (HCC) 11/11/2016    Orientation RESPIRATION BLADDER Height & Weight     Self, Situation, Place  Normal Continent Weight: 154 lb 11.2 oz (70.2 kg) Height:  5\' 8"  (172.7 cm)  BEHAVIORAL SYMPTOMS/MOOD NEUROLOGICAL BOWEL NUTRITION STATUS      Continent Diet (Heart Healthy, Carb Modified)  AMBULATORY STATUS COMMUNICATION OF NEEDS Skin   Independent Verbally Normal                       Personal Care Assistance Level of Assistance  Bathing, Feeding, Dressing Bathing Assistance: Limited assistance Feeding assistance: Independent Dressing Assistance: Limited assistance     Functional Limitations Info             SPECIAL CARE FACTORS  FREQUENCY                       Contractures      Additional Factors Info                  Current Medications (05/09/2017):  This is the current hospital active medication list Current Facility-Administered Medications  Medication Dose Route Frequency Provider Last Rate Last Dose  . 0.9 %  sodium chloride infusion   Intravenous Continuous Marguarite Arbour, MD   Stopped at 05/09/17 [phone]  . acetaminophen (TYLENOL) tablet 650 mg  650 mg Oral Q6H PRN Marguarite Arbour, MD       Or  . acetaminophen (TYLENOL) suppository 650 mg  650 mg Rectal Q6H PRN Marguarite Arbour, MD      . apixaban Everlene Balls) tablet 5 mg  5 mg Oral BID Marguarite Arbour, MD   Stopped at 05/09/17 1000  . atorvastatin (LIPITOR) tablet 10 mg  10 mg Oral Daily Marguarite Arbour, MD   Stopped at 05/09/17 1000  . bisacodyl (DULCOLAX) suppository 10 mg  10 mg Rectal Daily PRN Marguarite Arbour, MD      . carvedilol (COREG) tablet 3.125 mg  3.125 mg Oral BID WC Marguarite Arbour, MD   Stopped at 05/09/17 0800  . docusate sodium (COLACE) capsule 100 mg  100 mg Oral BID Marguarite Arbour, MD   Stopped at 05/09/17 1000  . dorzolamide-timolol (COSOPT) 22.3-6.8 MG/ML  ophthalmic solution 1 drop  1 drop Both Eyes BID Marguarite ArbourSparks, Jeffrey D, MD   Stopped at 05/09/17 1000  . latanoprost (XALATAN) 0.005 % ophthalmic solution 1 drop  1 drop Both Eyes QHS Marguarite ArbourSparks, Jeffrey D, MD   1 drop at 05/08/17 2100  . lisinopril (PRINIVIL,ZESTRIL) tablet 5 mg  5 mg Oral Daily Marguarite ArbourSparks, Jeffrey D, MD   Stopped at 05/09/17 1000  . LORazepam (ATIVAN) tablet 0.5 mg  0.5 mg Oral Daily PRN Marguarite ArbourSparks, Jeffrey D, MD      . multivitamin with minerals tablet 1 tablet  1 tablet Oral Daily Marguarite ArbourSparks, Jeffrey D, MD   Stopped at 05/09/17 1000  . ondansetron (ZOFRAN) tablet 4 mg  4 mg Oral Q6H PRN Marguarite ArbourSparks, Jeffrey D, MD       Or  . ondansetron [facility](ZOFRAN) injection 4 mg  4 mg Intravenous Q6H PRN Marguarite ArbourSparks, Jeffrey D, MD      . vitamin B-12 (CYANOCOBALAMIN) tablet 1,000 mcg   1,000 mcg Oral Daily Marguarite ArbourSparks, Jeffrey D, MD   Stopped at 05/09/17 1000     Discharge Medications: Current Discharge Medication List        CONTINUE these medications which have NOT CHANGED   Details  apixaban (ELIQUIS) 5 MG TABS tablet Take 1 tablet (5 mg total) by mouth 2 (two) times daily. Qty: 60 tablet, Refills: 6    atorvastatin (LIPITOR) 10 MG tablet Take 10 mg by mouth daily.    carvedilol (COREG) 3.125 MG tablet Take 1 tablet (3.125 mg total) by mouth 2 (two) times daily with a meal. Qty: 60 tablet, Refills: 5    dorzolamide-timolol (COSOPT) 22.3-6.8 MG/ML ophthalmic solution Place 1 drop into both eyes 2 (two) times daily.    latanoprost (XALATAN) 0.005 % ophthalmic solution Place 1 drop into both eyes at bedtime.    lisinopril (PRINIVIL,ZESTRIL) 5 MG tablet Take 5 mg by mouth daily.    LORazepam (ATIVAN) 0.5 MG tablet Take 1 tablet (0.5 mg total) by mouth daily as needed for anxiety (agitation/sundowning). Qty: 30 tablet, Refills: 0    Multiple Vitamin (THEREMS) TABS Take 1 tablet by mouth daily.    trolamine salicylate (ASPERCREME) 10 % cream Apply topically 2 (two) times daily. Qty: 85 g, Refills: 0    vitamin B-12 (CYANOCOBALAMIN) 1000 MCG tablet Take 1,000 mcg by mouth daily.         STOP taking these medications     HYDROcodone-acetaminophen (NORCO/VICODIN) 5-325 MG tablet       Relevant Imaging Results:  Relevant Lab Results:   Additional Information SS# [id number]  Judi CongKaren M Deeanne Deininger, LCSW

## 2017-05-09 NOTE — Care Management Obs Status (Signed)
MEDICARE OBSERVATION STATUS NOTIFICATION   Patient Details  Name: Theresa Santos MRN: [id number] Date of Birth: 05/15/1931   Medicare Observation Status Notification Given:  No (discharge order in less than 24 hours)    Caren MacadamMichelle Nyaire Denbleyker, RN 05/09/2017, 10:09 AM

## 2017-05-09 NOTE — Progress Notes (Signed)
PT Cancellation Note  Patient Details Name: Theresa Santos MRN: [id number] DOB: 05/15/1931   Cancelled Treatment:    Reason Eval/Treat Not Completed: Other (comment). Patient is currently out at RN station eating breakfast, has refused even nursing assessment this morning. She has ambulated several laps around RN station with nursing staff and no AD. She currently has d/c orders in to return home. RN staff asked to allow patient to finish breakfast as it is extremely unlikely she would participate in therapy. Given the above, she likely has no acute PT needs, though PT can re-attempt once she is finished with her breakfast if she is willing to participate.   Alva GarnetPatrick Chelle Cayton PT, DPT, CSCS    05/09/2017, 11:10 AM

## 2017-05-09 NOTE — Clinical Social Work Note (Signed)
Clinical Social Work Assessment  Patient Details  Name: Theresa Santos MRN: [id number] Date of Birth: 05/15/1931  Date of referral:  05/09/17               Reason for consult:  Facility Placement                Permission sought to share information with:  Facility Industrial/product designerContact Representative Permission granted to share information::     Name::        Agency::     Relationship::     Contact Information:     Housing/Transportation Living arrangements for the past 2 months:  [facility] Source of Information:  Medical Team, Facility Patient Interpreter Needed:  None Criminal Activity/Legal Involvement Pertinent to Current Situation/Hospitalization:  No - Comment as needed Significant Relationships:  Adult Children Lives with:  Facility Resident Do you feel safe going back to the place where you live?  Yes Need for family participation in patient care:  Yes (Comment)  Care giving concerns:  Patient admitted from facility.   Social Worker assessment / plan:  CSW received verbal consult from attending MD that the patient is a resident of [facility] [address]. The patient was admitted for observation due to syncope while spending time with her daughter outside of the facility. The patient has been cleared for discharge; the CSW attempted to contact the patient's daughter and is awaiting a call back to facilitate discharge.  Employment status:  Retired Health and safety inspectornsurance information:  Medicare PT Recommendations:    Information / Referral to community resources:     Patient/Family's Response to care:  The patient is agreeable to discharge.  Patient/Family's Understanding of and Emotional Response to Diagnosis, Current Treatment, and Prognosis:  The patient understands that she is able to discharge back to her ALF.  Emotional Assessment Appearance:  Appears stated age Attitude/Demeanor/Rapport:   (Pleasant) Affect (typically observed):  Accepting, Appropriate,  Pleasant Orientation:  Oriented to Self, Oriented to Place Alcohol / Substance use:  Never Used Psych involvement (Current and /or in the community):  No (Comment)  Discharge Needs  Concerns to be addressed:  Care Coordination Readmission within the last 30 days:  No Current discharge risk:  None Barriers to Discharge:  No Barriers Identified   Judi CongKaren M Porfirio Bollier, LCSW 05/09/2017, 10:05 AM

## 2017-05-09 NOTE — Clinical Social Work Note (Signed)
Per the attending MD, the patient will be transported by her family at discharge. Packet has been delivered to the unit. CSW will continue to follow pending additional discharge needs.  Argentina PonderKaren Martha Anel Creighton, MSW, Theresia MajorsLCSWA [phone]

## 2017-05-09 NOTE — Progress Notes (Signed)
05/09/2017  12:30  Theresa Santos to be D/C'd assisted living per MD order.  Discussed prescriptions and follow up appointments with the patient. Prescriptions given to patient, medication list explained in detail. Pt verbalized understanding.  Allergies as of 05/09/2017   No Known Allergies     Medication List    STOP taking these medications   HYDROcodone-acetaminophen 5-325 MG tablet Commonly known as:  NORCO/VICODIN     TAKE these medications   apixaban 5 MG Tabs tablet Commonly known as:  ELIQUIS Take 1 tablet (5 mg total) by mouth 2 (two) times daily. What changed:  Another medication with the same name was removed. Continue taking this medication, and follow the directions you see here.   atorvastatin 10 MG tablet Commonly known as:  LIPITOR Take 10 mg by mouth daily.   carvedilol 3.125 MG tablet Commonly known as:  COREG Take 1 tablet (3.125 mg total) by mouth 2 (two) times daily with a meal.   dorzolamide-timolol 22.3-6.8 MG/ML ophthalmic solution Commonly known as:  COSOPT Place 1 drop into both eyes 2 (two) times daily.   latanoprost 0.005 % ophthalmic solution Commonly known as:  XALATAN Place 1 drop into both eyes at bedtime.   lisinopril 5 MG tablet Commonly known as:  PRINIVIL,ZESTRIL Take 5 mg by mouth daily.   LORazepam 0.5 MG tablet Commonly known as:  ATIVAN Take 1 tablet (0.5 mg total) by mouth daily as needed for anxiety (agitation/sundowning).   THEREMS Tabs Take 1 tablet by mouth daily.   trolamine salicylate 10 % cream Commonly known as:  ASPERCREME Apply topically 2 (two) times daily. What changed:  how much to take  when to take this  reasons to take this  additional instructions   vitamin B-12 1000 MCG tablet Commonly known as:  CYANOCOBALAMIN Take 1,000 mcg by mouth daily.       Vitals:   05/08/17 2037 05/09/17 0430  BP: 135/69 (!) 145/64  Pulse: 85 79  Resp: 16 16  Temp: 98.1 F (36.7 C) 98 F (36.7 C)     Skin clean, dry and intact without evidence of skin break down, no evidence of skin tears noted. IV catheter discontinued intact. Site without signs and symptoms of complications. Dressing and pressure applied. Pt denies pain at this time. No complaints noted.  An After Visit Summary was printed and given to the patient. Patient escorted via WC, and D/C to [facility] via private auto.  Bradly Chrisougherty, Azekiel Cremer E

## 2017-05-09 NOTE — Discharge Summary (Signed)
[facility] - [place] at [facility]   PATIENT NAME: Theresa Santos    MR#:  [id number]  DATE OF BIRTH:  05/15/1931  DATE OF ADMISSION:  05/08/2017 ADMITTING PHYSICIAN: Marguarite Arbour, MD  DATE OF DISCHARGE: 05/09/17  PRIMARY CARE PHYSICIAN: Leotis Shames, MD    ADMISSION DIAGNOSIS:  Syncope [R55] Syncope, unspecified syncope type [R55]  DISCHARGE DIAGNOSIS:  Syncope suspected due to mild dehydration  SECONDARY DIAGNOSIS:   Past Medical History:  Diagnosis Date  . Chronic kidney disease   . Diabetes mellitus without complication (HCC)   . High cholesterol   . Hypertension   . TIA (transient ischemic attack)   . Vascular dementia   . Vitamin B12 deficiency     HOSPITAL COURSE:  Theresa Santos is a 83 y.o. female has a past medical history significant for HTN, DM, dementia, and CKD now with syncopal episode witnessed by daughter. ER eval including CT negative. Pt is without complaints. No fever. Denies CP or SOB. No palpitations.   1.Syncope -suspected due to dehydration Pt has CKD-II Came inw ith creat of 1.43---IV fluids--- 1.2 -orthostatic vitals ok -pt asymptomatic -CT head neg, Carotids no stenosis -MRI hold off. Pt has dementia and very restless to get out of bed and leave. Spoke with dter Theresa Santos. No neuro deficits.   2. CKD (chronic kidney disease), stage III -creat today 1.28  3. H/o PE -on eliquis  4. HTN, goal below 140/80 -cont home meds  Will d/c to [facility]. Dter will transport pt back. CSW aware.  CONSULTS OBTAINED:    DRUG ALLERGIES:  No Known Allergies  DISCHARGE MEDICATIONS:   Current Discharge Medication List    CONTINUE these medications which have NOT CHANGED   Details  apixaban (ELIQUIS) 5 MG TABS tablet Take 1 tablet (5 mg total) by mouth 2 (two) times daily. Qty: 60 tablet, Refills: 6    atorvastatin (LIPITOR) 10 MG tablet Take 10 mg by mouth daily.    carvedilol (COREG) 3.125 MG tablet  Take 1 tablet (3.125 mg total) by mouth 2 (two) times daily with a meal. Qty: 60 tablet, Refills: 5    dorzolamide-timolol (COSOPT) 22.3-6.8 MG/ML ophthalmic solution Place 1 drop into both eyes 2 (two) times daily.    latanoprost (XALATAN) 0.005 % ophthalmic solution Place 1 drop into both eyes at bedtime.    lisinopril (PRINIVIL,ZESTRIL) 5 MG tablet Take 5 mg by mouth daily.    LORazepam (ATIVAN) 0.5 MG tablet Take 1 tablet (0.5 mg total) by mouth daily as needed for anxiety (agitation/sundowning). Qty: 30 tablet, Refills: 0    Multiple Vitamin (THEREMS) TABS Take 1 tablet by mouth daily.    trolamine salicylate (ASPERCREME) 10 % cream Apply topically 2 (two) times daily. Qty: 85 g, Refills: 0    vitamin B-12 (CYANOCOBALAMIN) 1000 MCG tablet Take 1,000 mcg by mouth daily.      STOP taking these medications     HYDROcodone-acetaminophen (NORCO/VICODIN) 5-325 MG tablet         If you experience worsening of your admission symptoms, develop shortness of breath, life threatening emergency, suicidal or homicidal thoughts you must seek medical attention immediately by calling 911 or calling your MD immediately  if symptoms less severe.  You Must read complete instructions/literature along with all the possible adverse reactions/side effects for all the Medicines you take and that have been prescribed to you. Take any new Medicines after you have completely understood and accept all the possible adverse reactions/side  effects.   Please note  You were cared for by a hospitalist during your hospital stay. If you have any questions about your discharge medications or the care you received while you were in the hospital after you are discharged, you can call the unit and asked to speak with the hospitalist on call if the hospitalist that took care of you is not available. Once you are discharged, your primary care physician will handle any further medical issues. Please note that NO REFILLS  for any discharge medications will be authorized once you are discharged, as it is imperative that you return to your primary care physician (or establish a relationship with a primary care physician if you do not have one) for your aftercare needs so that they can reassess your need for medications and monitor your lab values. Today   SUBJECTIVE   Poor historian due to dementia. Laying in bed. Per RN earlier wanted to go home and trying to get up VITAL SIGNS:  Blood pressure (!) 145/64, pulse 79, temperature 98 F (36.7 C), temperature source Oral, resp. rate 16, height 5\' 8"  (1.727 m), weight 70.2 kg (154 lb 11.2 oz), SpO2 98 %.  I/O:   Intake/Output Summary (Last 24 hours) at 05/09/17 1004 Last data filed at 05/09/17 0900  Gross per 24 hour  Intake          1293.25 ml  Output             1151 ml  Net           142.25 ml    PHYSICAL EXAMINATION:  GENERAL:  83 y.o.-year-old patient lying in the bed with no acute distress.  EYES: Pupils equal, round, reactive to light and accommodation. No scleral icterus. Extraocular muscles intact.  HEENT: Head atraumatic, normocephalic. Oropharynx and nasopharynx clear.  NECK:  Supple, no jugular venous distention. No thyroid enlargement, no tenderness.  LUNGS: Normal breath sounds bilaterally, no wheezing, rales,rhonchi or crepitation. No use of accessory muscles of respiration.  CARDIOVASCULAR: S1, S2 normal. No murmurs, rubs, or gallops.  ABDOMEN: Soft, non-tender, non-distended. Bowel sounds present. No organomegaly or mass.  EXTREMITIES: No pedal edema, cyanosis, or clubbing.  NEUROLOGIC: Cranial nerves II through XII are intact. Muscle strength 5/5 in all extremities. Sensation intact. Gait not checked. No focal deficits. PSYCHIATRIC: The patient is alert   SKIN: No obvious rash, lesion, or ulcer.   DATA REVIEW:   CBC   Recent Labs Lab 05/09/17 0440  WBC 7.9  HGB 11.7*  HCT 34.5*  PLT 190    Chemistries   Recent Labs Lab  05/09/17 0440  NA 138  K 4.1  CL 109  CO2 23  GLUCOSE 140*  BUN 26*  CREATININE 1.28*  CALCIUM 8.6*  AST 26  ALT 25  ALKPHOS 88  BILITOT 0.8    Microbiology Results   Recent Results (from the past 240 hour(s))  MRSA PCR Screening     Status: None   Collection Time: 05/08/17  5:34 PM  Result Value Ref Range Status   MRSA by PCR NEGATIVE NEGATIVE Final    Comment:        The GeneXpert MRSA Assay (FDA approved for NASAL specimens only), is one component of a comprehensive MRSA colonization surveillance program. It is not intended to diagnose MRSA infection nor to guide or monitor treatment for MRSA infections.     RADIOLOGY:  Ct Head Wo Contrast  Result Date: 05/08/2017 CLINICAL DATA:  Syncope. EXAM: CT HEAD  WITHOUT CONTRAST TECHNIQUE: Contiguous axial images were obtained from the base of the skull through the vertex without intravenous contrast. COMPARISON:  11/15/2014 FINDINGS: Brain: Stable diffuse cortical atrophy. Stable moderately advanced small vessel disease of the periventricular white matter. The brain demonstrates no evidence of hemorrhage, acute infarction, edema, mass effect, extra-axial fluid collection, hydrocephalus or mass lesion. Vascular: No hyperdense vessel or unexpected calcification. Skull: Normal. Negative for fracture or focal lesion. Sinuses/Orbits: No acute finding. Other: None. IMPRESSION: Stable atrophy and small vessel disease.  No acute findings. Electronically Signed   By: Irish Lack M.D.   On: 05/08/2017 14:04   US Carotid Bilateral  Result Date: 05/08/2017 CLINICAL DATA:  Syncope EXAM: BILATERAL CAROTID DUPLEX ULTRASOUND TECHNIQUE: Wallace Cullens scale imaging, color Doppler and duplex ultrasound were performed of bilateral carotid and vertebral arteries in the neck. COMPARISON:  11/16/2014 carotid Doppler . FINDINGS: Criteria: Quantification of carotid stenosis is based on velocity parameters that correlate the residual internal carotid diameter  with NASCET-based stenosis levels, using the diameter of the distal internal carotid lumen as the denominator for stenosis measurement. The following velocity measurements were obtained: RIGHT ICA:  47 cm/sec CCA:  67 cm/sec SYSTOLIC ICA/CCA RATIO:  0.70 DIASTOLIC ICA/CCA RATIO:  0.85 ECA:  63 cm/sec LEFT ICA:  50 cm/sec 11.0 CCA:  67 cm/sec 14.7 SYSTOLIC ICA/CCA RATIO:  0.75 DIASTOLIC ICA/CCA RATIO:  0.75 ECA:  61 cm/sec RIGHT CAROTID ARTERY: Tortuous right common carotid artery. Mild calcified plaque in the right carotid bulb and bifurcation, with less than 20% right internal carotid artery stenosis by grayscale imaging. No hemodynamically significant right internal carotid artery stenosis by Doppler criteria . RIGHT VERTEBRAL ARTERY:  Normal antegrade flow. LEFT CAROTID ARTERY: Tortuous left common carotid artery. Mild calcified plaque in the left carotid bulb and bifurcation, with less than 20% left internal carotid artery stenosis by grayscale imaging. No hemodynamically significant left internal carotid artery stenosis by Doppler criteria. LEFT VERTEBRAL ARTERY:  Normal antegrade flow. IMPRESSION: 1. Mild calcified plaque in both carotid arteries, with no hemodynamically significant stenosis (0 - 49%). 2. Normal antegrade flow in both vertebral arteries. Electronically Signed   By: Delbert Phenix M.D.   On: 05/08/2017 17:16     Management plans discussed with the patient, family and they are in agreement.  CODE STATUS:     Code Status Orders        Start     Ordered   05/08/17 1707  Full code  Continuous     05/08/17 1707    Code Status History    Date Active Date Inactive Code Status Order ID Comments User Context   11/11/2016 12:12 PM 11/14/2016  8:17 PM Full Code [id number]  Gracelyn Nurse, MD Inpatient      TOTAL TIME TAKING CARE OF THIS PATIENT: *40* minutes.    Alexsandria Kivett M.D on 05/09/2017 at 10:04 AM  Between 7am to 6pm - Pager - [phone] After 6pm go to [email or web address] -  Social research officer, government  [facility]  Office  [phone]  CC: Primary care physician; Leotis Shames, MD

## 2017-05-20 ENCOUNTER — Other Ambulatory Visit: Payer: Self-pay | Admitting: Internal Medicine

## 2017-05-20 DIAGNOSIS — R55 Syncope and collapse: Secondary | ICD-10-CM

## 2017-07-04 ENCOUNTER — Encounter: Payer: Self-pay | Admitting: Emergency Medicine

## 2017-07-04 ENCOUNTER — Emergency Department
Admission: EM | Admit: 2017-07-04 | Discharge: 2017-07-04 | Disposition: A | Discharge status: Home / Self Care | Payer: Medicare Other | Attending: Emergency Medicine | Admitting: Emergency Medicine

## 2017-07-04 ENCOUNTER — Emergency Department: Payer: Medicare Other

## 2017-07-04 DIAGNOSIS — S0083XA Contusion of other part of head, initial encounter: Secondary | ICD-10-CM | POA: Diagnosis present

## 2017-07-04 DIAGNOSIS — Z7901 Long term (current) use of anticoagulants: Secondary | ICD-10-CM | POA: Insufficient documentation

## 2017-07-04 DIAGNOSIS — I6782 Cerebral ischemia: Secondary | ICD-10-CM | POA: Diagnosis not present

## 2017-07-04 DIAGNOSIS — Y999 Unspecified external cause status: Secondary | ICD-10-CM | POA: Insufficient documentation

## 2017-07-04 DIAGNOSIS — Y9210 Unspecified residential institution as the place of occurrence of the external cause: Secondary | ICD-10-CM | POA: Diagnosis not present

## 2017-07-04 DIAGNOSIS — W0110XA Fall on same level from slipping, tripping and stumbling with subsequent striking against unspecified object, initial encounter: Secondary | ICD-10-CM | POA: Insufficient documentation

## 2017-07-04 DIAGNOSIS — I129 Hypertensive chronic kidney disease with stage 1 through stage 4 chronic kidney disease, or unspecified chronic kidney disease: Secondary | ICD-10-CM | POA: Diagnosis not present

## 2017-07-04 DIAGNOSIS — N183 Chronic kidney disease, stage 3 (moderate): Secondary | ICD-10-CM | POA: Insufficient documentation

## 2017-07-04 DIAGNOSIS — Y939 Activity, unspecified: Secondary | ICD-10-CM | POA: Diagnosis not present

## 2017-07-04 DIAGNOSIS — W19XXXA Unspecified fall, initial encounter: Secondary | ICD-10-CM

## 2017-07-04 DIAGNOSIS — E1122 Type 2 diabetes mellitus with diabetic chronic kidney disease: Secondary | ICD-10-CM | POA: Insufficient documentation

## 2017-07-04 DIAGNOSIS — Z79899 Other long term (current) drug therapy: Secondary | ICD-10-CM | POA: Insufficient documentation

## 2017-07-04 NOTE — Discharge Instructions (Signed)

## 2017-07-04 NOTE — ED Triage Notes (Signed)
Pt arrives via [facility] from [facility] for c/o fall. Pt is alert and oriented x 4 and denies any LOC but did hit her head during fall. Pt is on Eliquis and per EMS had BP 160/98, 80 HR, and is 98% on RA. Pt is in NAD at this time.

## 2017-07-04 NOTE — ED Provider Notes (Signed)
[facility] Emergency Department Provider Note  ____________________________________________   First MD Initiated Contact with Patient 07/04/17 0240     (approximate)  I have reviewed the triage vital signs and the nursing notes.   HISTORY  Chief Complaint Fall    HPI Theresa Santos is a 83 y.o. female brought by EMS for evaluation after a fall at [facility].  She is not certain exactly what happened, but believes that she got up to go to the bathroom and tripped and struck the left side of her head/face on something.  She does not believe she lost consciousness and reports no pain in her head or neck.  She denies chest pain, shortness of breath, nausea, vomiting, abdominal pain, dysuria.  She reports that the event happened acutely but cannot quantify the severity.  She has not been ill recently and is alert and oriented 4 at this time, laughing and joking with [place] and in no distress.  She does take Eliquis.   Past Medical History:  Diagnosis Date  . Chronic kidney disease   . Diabetes mellitus without complication (HCC)   . High cholesterol   . Hypertension   . TIA (transient ischemic attack)   . Vascular dementia   . Vitamin B12 deficiency     Patient Active Problem List   Diagnosis Date Noted  . Syncope 05/08/2017  . HTN, goal below 140/80 05/08/2017  . Knee effusion, left 11/14/2016  . Hemarthrosis 11/14/2016  . Cardiomyopathy (HCC) 11/14/2016  . Acute pulmonary embolism (HCC) 11/13/2016  . Metabolic acidosis 11/13/2016  . CKD (chronic kidney disease), stage III 11/13/2016  . Hypokalemia 11/13/2016  . Elevated troponin 11/13/2016  . Leukocytosis 11/13/2016  . Thrombocytopenia (HCC) 11/13/2016  . Right leg pain 11/13/2016  . Fall 11/13/2016  . Acute respiratory failure with hypoxia (HCC) 11/11/2016    Past Surgical History:  Procedure Laterality Date  . Left knee surgery     > 20 years ago    Prior to  Admission medications   Medication Sig Start Date End Date Taking? Authorizing Provider  apixaban (ELIQUIS) 5 MG TABS tablet Take 1 tablet (5 mg total) by mouth 2 (two) times daily. 11/20/16   Katharina Caper, MD  atorvastatin (LIPITOR) 10 MG tablet Take 10 mg by mouth daily.    [provider]  carvedilol (COREG) 3.125 MG tablet Take 1 tablet (3.125 mg total) by mouth 2 (two) times daily with a meal. 11/14/16   Katharina Caper, MD  dorzolamide-timolol (COSOPT) 22.3-6.8 MG/ML ophthalmic solution Place 1 drop into both eyes 2 (two) times daily.    [provider]  latanoprost (XALATAN) 0.005 % ophthalmic solution Place 1 drop into both eyes at bedtime.    [provider]  lisinopril (PRINIVIL,ZESTRIL) 5 MG tablet Take 5 mg by mouth daily.    [provider]  LORazepam (ATIVAN) 0.5 MG tablet Take 1 tablet (0.5 mg total) by mouth daily as needed for anxiety (agitation/sundowning). 11/13/16   Katharina Caper, MD  Multiple Vitamin (THEREMS) TABS Take 1 tablet by mouth daily.    [provider]  trolamine salicylate (ASPERCREME) 10 % cream Apply topically 2 (two) times daily. Patient taking differently: Apply 1 application topically 2 (two) times daily as needed. To knees 11/14/16   Katharina Caper, MD  vitamin B-12 (CYANOCOBALAMIN) 1000 MCG tablet Take 1,000 mcg by mouth daily.    [provider]    Allergies Patient has no known allergies.  No family  history on file.  Social History Social History  Substance Use Topics  . Smoking status: Never Smoker  . Smokeless tobacco: Never Used  . Alcohol use No    Review of Systems Constitutional: No fever/chills Eyes: No visual changes. ENT: No sore throat. Cardiovascular: Denies chest pain. Respiratory: Denies shortness of breath. Gastrointestinal: No abdominal pain.  No nausea, no vomiting.  No diarrhea.  No constipation. Genitourinary: Negative for dysuria. Musculoskeletal: obvious contusion  to the left side of face. Negative for neck pain.  Negative for back pain. Integumentary: Negative for rash. Neurological: Negative for headaches, focal weakness or numbness.   ____________________________________________   PHYSICAL EXAM:  VITAL SIGNS: ED Triage Vitals  Enc Vitals Group     BP 07/04/17 0230 (!) 161/87     Pulse Rate 07/04/17 0230 85     Resp 07/04/17 0230 18     Temp 07/04/17 0234 (!) 97.5 F (36.4 C)     Temp Source 07/04/17 0234 Oral     SpO2 07/04/17 0230 97 %     Weight 07/04/17 0223 72.6 kg (160 lb)     Height 07/04/17 0223 1.702 m (5\' 7" )     Head Circumference --      Peak Flow --      Pain Score 07/04/17 0223 0     Pain Loc --      Pain Edu? --      Excl. in GC? --     Constitutional: Alert and oriented. Well appearing and in no acute distress. Eyes: Conjunctivae are normal. PERRL. EOMI. Head: diffusion and ecchymosis to the left side of her face including her left orbit.  No tenderness to palpation. Nose: No congestion/rhinnorhea. Mouth/Throat: Mucous membranes are moist. Neck: No stridor.  No meningeal signs.  No cervical spine tenderness to palpation. Cardiovascular: Normal rate, regular rhythm. Good peripheral circulation. Grossly normal heart sounds. Respiratory: Normal respiratory effort.  No retractions. Lungs CTAB. Gastrointestinal: Soft and nontender. No distention.  Musculoskeletal: No lower extremity tenderness nor edema. No gross deformities of extremities. able to ambulate without difficulty.  No pain nor tenderness with range of motion of upper and lower extremity joints Neurologic:  Normal speech and language. No gross focal neurologic deficits are appreciated.  Skin:  Skin is warm, dry and intact. No rash noted. Psychiatric: Mood and affect are normal. Speech and behavior are normal.  ____________________________________________   LABS (all labs ordered are listed, but only abnormal results are displayed)  Labs Reviewed - No  data to display ____________________________________________  EKG  ED ECG REPORT I, Jalan Bodi, the attending physician, personally viewed and interpreted this ECG.  Date: 07/04/2017 EKG Time: 2:25 AM Rate: 89 Rhythm: normal sinus rhythm QRS Axis: normal Intervals: normal ST/T Wave abnormalities: normal Narrative Interpretation: unremarkable  ____________________________________________  RADIOLOGY   Ct Head Wo Contrast  Result Date: 07/04/2017 CLINICAL DATA:  Status post fall, with injury to the head. Patient on Eliquis. High blood pressure. Initial encounter. EXAM: CT HEAD WITHOUT CONTRAST TECHNIQUE: Contiguous axial images were obtained from the base of the skull through the vertex without intravenous contrast. COMPARISON:  CT of the head performed 05/08/2017 FINDINGS: Brain: No evidence of acute infarction, hemorrhage, hydrocephalus, extra-axial collection or mass lesion/mass effect. Prominence of the ventricles and sulci reflects moderate cortical volume loss. Cerebellar atrophy is noted. Scattered periventricular and subcortical white matter change likely reflects small vessel ischemic microangiopathy. The brainstem and fourth ventricle are within normal limits. The basal ganglia are unremarkable in appearance.  The cerebral hemispheres demonstrate grossly normal gray-white differentiation. No mass effect or midline shift is seen. Vascular: No hyperdense vessel or unexpected calcification. Skull: There is no evidence of fracture; visualized osseous structures are unremarkable in appearance. Sinuses/Orbits: The visualized portions of the orbits are within normal limits. The paranasal sinuses and mastoid air cells are well-aerated. Other: No significant soft tissue abnormalities are seen. IMPRESSION: 1. No evidence of traumatic intracranial injury or fracture. 2. Moderate cortical volume loss and scattered small vessel ischemic microangiopathy. Electronically Signed   By: Roanna RaiderJeffery  Chang  M.D.   On: 07/04/2017 03:01    ____________________________________________   PROCEDURES  Critical Care performed: No   Procedure(s) performed:   Procedures   ____________________________________________   INITIAL IMPRESSION / ASSESSMENT AND PLAN / ED COURSE  Pertinent labs & imaging results that were available during my care of the patient were reviewed by me and considered in my medical decision making (see chart for details).  the patient has an obvious contusion on her face and given that she is on Eliquis so we obtained a CT scan of her head but it did not identify any acute or emergent causes.  I believe this was a mechanical fall and she is in no distress, asymptomatic, no recent signs or symptoms of urinary tract infection, and a reassuring EKG.  There is no sign of acute or emergent medical condition I feel she is safe to go back to her home.  I updated the patient and she agrees with the plan.      ____________________________________________  FINAL CLINICAL IMPRESSION(S) / ED DIAGNOSES  Final diagnoses:  Fall, initial encounter  Contusion of face, initial encounter     MEDICATIONS GIVEN DURING THIS VISIT:  Medications - No data to display   NEW OUTPATIENT MEDICATIONS STARTED DURING THIS VISIT:  New Prescriptions   No medications on file    Modified Medications   No medications on file    Discontinued Medications   No medications on file     Note:  This document was prepared using Dragon voice recognition software and may include unintentional dictation errors.    Loleta RoseForbach, Eoin Willden, MD 07/04/17 [phone]

## 2017-07-28 ENCOUNTER — Emergency Department: Payer: Medicare Other

## 2017-07-28 ENCOUNTER — Emergency Department
Admission: EM | Admit: 2017-07-28 | Discharge: 2017-07-29 | Disposition: A | Discharge status: Skilled Nursing Facility | Payer: Medicare Other | Attending: Student in an Organized Health Care Education/Training Program | Admitting: Student in an Organized Health Care Education/Training Program

## 2017-07-28 DIAGNOSIS — Y939 Activity, unspecified: Secondary | ICD-10-CM | POA: Insufficient documentation

## 2017-07-28 DIAGNOSIS — S0990XA Unspecified injury of head, initial encounter: Secondary | ICD-10-CM | POA: Diagnosis not present

## 2017-07-28 DIAGNOSIS — Z7901 Long term (current) use of anticoagulants: Secondary | ICD-10-CM | POA: Diagnosis not present

## 2017-07-28 DIAGNOSIS — Z79899 Other long term (current) drug therapy: Secondary | ICD-10-CM | POA: Diagnosis not present

## 2017-07-28 DIAGNOSIS — Y92003 Bedroom of unspecified non-institutional (private) residence as the place of occurrence of the external cause: Secondary | ICD-10-CM | POA: Diagnosis not present

## 2017-07-28 DIAGNOSIS — W19XXXA Unspecified fall, initial encounter: Secondary | ICD-10-CM

## 2017-07-28 DIAGNOSIS — E1122 Type 2 diabetes mellitus with diabetic chronic kidney disease: Secondary | ICD-10-CM | POA: Insufficient documentation

## 2017-07-28 DIAGNOSIS — W06XXXA Fall from bed, initial encounter: Secondary | ICD-10-CM | POA: Diagnosis not present

## 2017-07-28 DIAGNOSIS — I129 Hypertensive chronic kidney disease with stage 1 through stage 4 chronic kidney disease, or unspecified chronic kidney disease: Secondary | ICD-10-CM | POA: Insufficient documentation

## 2017-07-28 DIAGNOSIS — Z8673 Personal history of transient ischemic attack (TIA), and cerebral infarction without residual deficits: Secondary | ICD-10-CM | POA: Diagnosis not present

## 2017-07-28 DIAGNOSIS — N183 Chronic kidney disease, stage 3 (moderate): Secondary | ICD-10-CM | POA: Insufficient documentation

## 2017-07-28 DIAGNOSIS — Y999 Unspecified external cause status: Secondary | ICD-10-CM | POA: Insufficient documentation

## 2017-07-28 NOTE — ED Provider Notes (Signed)
[facility] Emergency Department Provider Note    First MD Initiated Contact with Patient 07/28/17 2328     (approximate)  I have reviewed the triage vital signs and the nursing notes.   HISTORY  Chief Complaint Fall    HPI Theresa Santos is a 83 y.o. female presents from [facility] after unwitnessed fall and head injury. Patient states that she was getting up out of her bed to go use the restroom. States that she lost her balance after getting her feet tangled up in the sheets and fell and hit her head on the corner of the bed. Denies loss of consciousness. Denies any chest pain. No shortness of breath. No dysuria. No fevers. Vital signs are stable in route. She's not on any blood thinners.   Past Medical History:  Diagnosis Date  . Chronic kidney disease   . Diabetes mellitus without complication (HCC)   . High cholesterol   . Hypertension   . TIA (transient ischemic attack)   . Vascular dementia   . Vitamin B12 deficiency    No family history on file. Past Surgical History:  Procedure Laterality Date  . Left knee surgery     > 20 years ago   Patient Active Problem List   Diagnosis Date Noted  . Syncope 05/08/2017  . HTN, goal below 140/80 05/08/2017  . Knee effusion, left 11/14/2016  . Hemarthrosis 11/14/2016  . Cardiomyopathy (HCC) 11/14/2016  . Acute pulmonary embolism (HCC) 11/13/2016  . Metabolic acidosis 11/13/2016  . CKD (chronic kidney disease), stage III 11/13/2016  . Hypokalemia 11/13/2016  . Elevated troponin 11/13/2016  . Leukocytosis 11/13/2016  . Thrombocytopenia (HCC) 11/13/2016  . Right leg pain 11/13/2016  . Fall 11/13/2016  . Acute respiratory failure with hypoxia (HCC) 11/11/2016      Prior to Admission medications   Medication Sig Start Date End Date Taking? Authorizing Provider  apixaban (ELIQUIS) 5 MG TABS tablet Take 1 tablet (5 mg total) by mouth 2 (two) times daily. 11/20/16   Katharina CaperVaickute, Rima, MD    atorvastatin (LIPITOR) 10 MG tablet Take 10 mg by mouth daily.    [provider]  carvedilol (COREG) 3.125 MG tablet Take 1 tablet (3.125 mg total) by mouth 2 (two) times daily with a meal. 11/14/16   Katharina CaperVaickute, Rima, MD  dorzolamide-timolol (COSOPT) 22.3-6.8 MG/ML ophthalmic solution Place 1 drop into both eyes 2 (two) times daily.    [provider]  latanoprost (XALATAN) 0.005 % ophthalmic solution Place 1 drop into both eyes at bedtime.    [provider]  lisinopril (PRINIVIL,ZESTRIL) 5 MG tablet Take 5 mg by mouth daily.    [provider]  LORazepam (ATIVAN) 0.5 MG tablet Take 1 tablet (0.5 mg total) by mouth daily as needed for anxiety (agitation/sundowning). 11/13/16   Katharina CaperVaickute, Rima, MD  Multiple Vitamin (THEREMS) TABS Take 1 tablet by mouth daily.    [provider]  trolamine salicylate (ASPERCREME) 10 % cream Apply topically 2 (two) times daily. Patient taking differently: Apply 1 application topically 2 (two) times daily as needed. To knees 11/14/16   Katharina CaperVaickute, Rima, MD  vitamin B-12 (CYANOCOBALAMIN) 1000 MCG tablet Take 1,000 mcg by mouth daily.    [provider]    Allergies Patient has no known allergies.    Social History Social History  Substance Use Topics  . Smoking status: Never Smoker  . Smokeless tobacco: Never Used  . Alcohol use No    Review of Systems Patient  denies headaches, rhinorrhea, blurry vision, numbness, shortness of breath, chest pain, edema, cough, abdominal pain, nausea, vomiting, diarrhea, dysuria, fevers, rashes or hallucinations unless otherwise stated above in HPI. ____________________________________________   PHYSICAL EXAM:  VITAL SIGNS: Vitals:   07/28/17 2330 07/28/17 2331  BP: (!) 168/88 (!) 168/88  Pulse: 83 82  Resp:  18  Temp:  98.3 F (36.8 C)    Constitutional: Alert, elderly and frail but well appearing and in no acute distress. Eyes: Conjunctivae are normal.   Head: no obvious trauma or Nose: No congestion/rhinnorhea. Mouth/Throat: Mucous membranes are moist.   Neck: No stridor. Painless ROM.   No stepoffs or deformities Cardiovascular: Normal rate, regular rhythm. Grossly normal heart sounds.  Good peripheral circulation. Respiratory: Normal respiratory effort.  No retractions. Lungs CTAB. Gastrointestinal: Soft and nontender. No distention. No abdominal bruits. No CVA tenderness. Genitourinary:  Musculoskeletal: No lower extremity tenderness nor edema.  No joint effusions. Neurologic:  Normal speech and language. No gross focal neurologic deficits are appreciated. No facial droop Skin:  Skin is warm, dry and intact. No rash noted. Psychiatric: Mood and affect are normal. Speech and behavior are normal.  ____________________________________________   LABS (all labs ordered are listed, but only abnormal results are displayed)  No results found for this or any previous visit (from the past 24 hour(s)). ____________________________________________ ____________________________________________  RADIOLOGY  I personally reviewed all radiographic images ordered to evaluate for the above acute complaints and reviewed radiology reports and findings.  These findings were personally discussed with the patient.  Please see medical record for radiology report.  ____________________________________________   PROCEDURES  Procedure(s) performed:  Procedures    Critical Care performed: no ____________________________________________   INITIAL IMPRESSION / ASSESSMENT AND PLAN / ED COURSE  Pertinent labs & imaging results that were available during my care of the patient were reviewed by me and considered in my medical decision making (see chart for details).  DDX: ich, concussion, fall, contusion  Theresa Santos is a 83 y.o. who presents to the ED with fall from bed as described above. Patient is AFVSS in ED. Exam as above. Given current  presentation have considered the above differential.  CT imaging ordered to evaluate for acute traumatic injury shows no ICH. CT scan shows some degenerative changes. Patient able to look and move neck in all 4 directions with no significant discomfort. Do not feel this represents acute ligamentous injury and is more likely degenerative. Patient requesting discharge home. Based on her negative workup and that she is otherwise a symptomatically do feel this is appropriate.  Have discussed with the patient and available family all diagnostics and treatments performed thus far and all questions were answered to the best of my ability. The patient demonstrates understanding and agreement with plan.       ____________________________________________   FINAL CLINICAL IMPRESSION(S) / ED DIAGNOSES  Final diagnoses:  Injury of head, initial encounter  Fall, initial encounter      NEW MEDICATIONS STARTED DURING THIS VISIT:  New Prescriptions   No medications on file     Note:  This document was prepared using Dragon voice recognition software and may include unintentional dictation errors.    Willy Eddyobinson, Marlena Barbato, MD 07/29/17 [phone]

## 2017-07-28 NOTE — ED Triage Notes (Signed)
Pt arrives to ED via [facility] from [facility] with c/o fall. Per EMS, pt with h/x of Alzheimer's and "got tripped up in her blankets when getting up from her bed". Pt denies any c/o pain, but does reports some dizziness. Fall was unwitnessed. EMS reports that facility workers state she "hit her head on the side rail of the bed" but EMS reports the pt's bed does not have bed rails. Pt is alert, in NAD; RR even, regular, and unlabored.

## 2017-12-09 ENCOUNTER — Other Ambulatory Visit: Payer: Self-pay

## 2017-12-09 ENCOUNTER — Emergency Department
Admission: EM | Admit: 2017-12-09 | Discharge: 2017-12-10 | Disposition: A | Discharge status: Nursing Home (Includes ICF) | Payer: Medicare Other | Attending: Emergency Medicine | Admitting: Emergency Medicine

## 2017-12-09 DIAGNOSIS — R739 Hyperglycemia, unspecified: Secondary | ICD-10-CM | POA: Diagnosis present

## 2017-12-09 DIAGNOSIS — I129 Hypertensive chronic kidney disease with stage 1 through stage 4 chronic kidney disease, or unspecified chronic kidney disease: Secondary | ICD-10-CM | POA: Diagnosis not present

## 2017-12-09 DIAGNOSIS — F0150 Vascular dementia without behavioral disturbance: Secondary | ICD-10-CM | POA: Diagnosis not present

## 2017-12-09 DIAGNOSIS — N183 Chronic kidney disease, stage 3 (moderate): Secondary | ICD-10-CM | POA: Insufficient documentation

## 2017-12-09 DIAGNOSIS — Z79899 Other long term (current) drug therapy: Secondary | ICD-10-CM | POA: Insufficient documentation

## 2017-12-09 DIAGNOSIS — N390 Urinary tract infection, site not specified: Secondary | ICD-10-CM | POA: Diagnosis not present

## 2017-12-09 DIAGNOSIS — Z7901 Long term (current) use of anticoagulants: Secondary | ICD-10-CM | POA: Insufficient documentation

## 2017-12-09 DIAGNOSIS — E1165 Type 2 diabetes mellitus with hyperglycemia: Secondary | ICD-10-CM | POA: Diagnosis not present

## 2017-12-09 LAB — BASIC METABOLIC PANEL
Anion gap: 10 (ref 5–15)
BUN: 13 mg/dL (ref 6–20)
CHLORIDE: 98 mmol/L — AB (ref 101–111)
CO2: 23 mmol/L (ref 22–32)
CREATININE: 1.53 mg/dL — AB (ref 0.44–1.00)
Calcium: 9 mg/dL (ref 8.9–10.3)
GFR calc Af Amer: 35 mL/min — ABNORMAL LOW (ref >60)
GFR calc non Af Amer: 30 mL/min — ABNORMAL LOW (ref >60)
GLUCOSE: 561 mg/dL — AB (ref 65–99)
POTASSIUM: 4.2 mmol/L (ref 3.5–5.1)
SODIUM: 131 mmol/L — AB (ref 135–145)

## 2017-12-09 LAB — CBC
HEMATOCRIT: 36.4 % (ref 35.0–47.0)
Hemoglobin: 12.1 g/dL (ref 12.0–16.0)
MCH: 31.4 pg (ref 26.0–34.0)
MCHC: 33.3 g/dL (ref 32.0–36.0)
MCV: 94.5 fL (ref 80.0–100.0)
PLATELETS: 199 10*3/uL (ref 150–440)
RBC: 3.86 MIL/uL (ref 3.80–5.20)
RDW: 14 % (ref 11.5–14.5)
WBC: 8.9 10*3/uL (ref 3.6–11.0)

## 2017-12-09 LAB — URINALYSIS, COMPLETE (UACMP) WITH MICROSCOPIC
BACTERIA UA: NONE SEEN
BILIRUBIN URINE: NEGATIVE
Glucose, UA: >=500 mg/dL — AB
Hgb urine dipstick: NEGATIVE
Ketones, ur: 5 mg/dL — AB
Nitrite: NEGATIVE
PH: 5 (ref 5.0–8.0)
Protein, ur: NEGATIVE mg/dL
SPECIFIC GRAVITY, URINE: 1.029 (ref 1.005–1.030)

## 2017-12-09 LAB — GLUCOSE, CAPILLARY
GLUCOSE-CAPILLARY: 443 mg/dL — AB (ref 65–99)
GLUCOSE-CAPILLARY: 346 mg/dL — AB (ref 65–99)

## 2017-12-09 MED ORDER — CEPHALEXIN 500 MG PO CAPS
500.0000 mg | ORAL_CAPSULE | Freq: Once | ORAL | Status: AC
  Administered 2017-12-09: 500 mg via ORAL
  Filled 2017-12-09: qty 1

## 2017-12-09 MED ORDER — SODIUM CHLORIDE 0.9 % IV BOLUS (SEPSIS)
1000.0000 mL | Freq: Once | INTRAVENOUS | Status: AC
  Administered 2017-12-09: 1000 mL via INTRAVENOUS

## 2017-12-09 MED ORDER — CEPHALEXIN 500 MG PO CAPS: 500.0000 mg | ORAL_CAPSULE | Freq: Three times a day (TID) | ORAL | 0 refills | Status: DC

## 2017-12-09 NOTE — ED Notes (Signed)
Pt assist to toilet without complication

## 2017-12-09 NOTE — Discharge Instructions (Signed)
Please seek medical attention for any high fevers, chest pain, shortness of breath, change in behavior, persistent vomiting, bloody stool or any other new or concerning symptoms.  

## 2017-12-09 NOTE — ED Notes (Signed)
Family remains at bedside. Update provided

## 2017-12-09 NOTE — ED Provider Notes (Signed)
[facility] Emergency Department Provider Note  ____________________________________________   I have reviewed the triage vital signs and the nursing notes.   HISTORY  Chief Complaint Hyperglycemia   History limited by and level 5 caveat due to dementia. Some history obtained from daughter.    HPI Theresa Santos is a 83 y.o. female who presents to the emergency department today because of concern for high blood sugar detected on blood work done by primary care.  DURATION:unknown SEVERITY: >600 CONTEXT: patient with history of diabetes. Per daughter patient has been losing significant weight over the past few months. Scheduled a doctors appointment today where blood work was checked. The patient's daughter was called when blood work returned with high blood sugar. MODIFYING FACTORS: none ASSOCIATED SYMPTOMS: none  Per medical record review patient has a history of diabetes. Dementia.   Past Medical History:  Diagnosis Date  . Chronic kidney disease   . Diabetes mellitus without complication (HCC)   . High cholesterol   . Hypertension   . TIA (transient ischemic attack)   . Vascular dementia   . Vitamin B12 deficiency     Patient Active Problem List   Diagnosis Date Noted  . Syncope 05/08/2017  . HTN, goal below 140/80 05/08/2017  . Knee effusion, left 11/14/2016  . Hemarthrosis 11/14/2016  . Cardiomyopathy (HCC) 11/14/2016  . Acute pulmonary embolism (HCC) 11/13/2016  . Metabolic acidosis 11/13/2016  . CKD (chronic kidney disease), stage III (HCC) 11/13/2016  . Hypokalemia 11/13/2016  . Elevated troponin 11/13/2016  . Leukocytosis 11/13/2016  . Thrombocytopenia (HCC) 11/13/2016  . Right leg pain 11/13/2016  . Fall 11/13/2016  . Acute respiratory failure with hypoxia (HCC) 11/11/2016    Past Surgical History:  Procedure Laterality Date  . Left knee surgery     > 20 years ago    Prior to Admission medications   Medication Sig  Start Date End Date Taking? Authorizing Provider  apixaban (ELIQUIS) 5 MG TABS tablet Take 1 tablet (5 mg total) by mouth 2 (two) times daily. 11/20/16   Katharina CaperVaickute, Rima, MD  atorvastatin (LIPITOR) 10 MG tablet Take 10 mg by mouth daily.    [provider]  carvedilol (COREG) 3.125 MG tablet Take 1 tablet (3.125 mg total) by mouth 2 (two) times daily with a meal. 11/14/16   Katharina CaperVaickute, Rima, MD  dorzolamide-timolol (COSOPT) 22.3-6.8 MG/ML ophthalmic solution Place 1 drop into both eyes 2 (two) times daily.    [provider]  latanoprost (XALATAN) 0.005 % ophthalmic solution Place 1 drop into both eyes at bedtime.    [provider]  lisinopril (PRINIVIL,ZESTRIL) 5 MG tablet Take 5 mg by mouth daily.    [provider]  LORazepam (ATIVAN) 0.5 MG tablet Take 1 tablet (0.5 mg total) by mouth daily as needed for anxiety (agitation/sundowning). 11/13/16   Katharina CaperVaickute, Rima, MD  Multiple Vitamin (THEREMS) TABS Take 1 tablet by mouth daily.    [provider]  trolamine salicylate (ASPERCREME) 10 % cream Apply topically 2 (two) times daily. Patient taking differently: Apply 1 application topically 2 (two) times daily as needed. To knees 11/14/16   Katharina CaperVaickute, Rima, MD  vitamin B-12 (CYANOCOBALAMIN) 1000 MCG tablet Take 1,000 mcg by mouth daily.    [provider]    Allergies Patient has no known allergies.  No family history on file.  Social History Social History   Tobacco Use  . Smoking status: Never Smoker  . Smokeless tobacco: Never Used  Substance Use Topics  .  Alcohol use: No  . Drug use: No    Review of Systems Constitutional: No fever/chills Eyes: No visual changes. ENT: No sore throat. Cardiovascular: Denies chest pain. Respiratory: Denies shortness of breath. Gastrointestinal: No abdominal pain.  No nausea, no vomiting.  No diarrhea.   Genitourinary: Negative for dysuria. Musculoskeletal: Negative for back pain. Skin: Negative  for rash. Neurological: Negative for headaches, focal weakness or numbness.  ____________________________________________   PHYSICAL EXAM:  VITAL SIGNS: ED Triage Vitals  Enc Vitals Group     BP 12/09/17 1705 (!) 159/89     Pulse Rate 12/09/17 1705 80     Resp 12/09/17 1705 14     Temp 12/09/17 1705 98 F (36.7 C)     Temp Source 12/09/17 1705 Oral     SpO2 12/09/17 1658 98 %     Weight 12/09/17 1704 128 lb 8.5 oz (58.3 kg)     Height 12/09/17 1704 5\' 4"  (1.626 m)   Constitutional: Awake and alert. No acute distress.  Eyes: Conjunctivae are normal.  ENT   Head: Normocephalic and atraumatic.   Nose: No congestion/rhinnorhea.   Mouth/Throat: Mucous membranes are moist.   Neck: No stridor. Hematological/Lymphatic/Immunilogical: No cervical lymphadenopathy. Cardiovascular: Normal rate, regular rhythm.  No murmurs, rubs, or gallops.  Respiratory: Normal respiratory effort without tachypnea nor retractions. Breath sounds are clear and equal bilaterally. No wheezes/rales/rhonchi. Gastrointestinal: Soft and non tender. No rebound. No guarding.  Genitourinary: Deferred Musculoskeletal: Normal range of motion in all extremities. No lower extremity edema. Neurologic:  Normal speech and language. No gross focal neurologic deficits are appreciated.  Skin:  Skin is warm, dry and intact. No rash noted. Psychiatric: Mood and affect are normal. Speech and behavior are normal. Patient exhibits appropriate insight and judgment.  ____________________________________________    LABS (pertinent positives/negatives)  UA wbc 6-30, trace leukocytes CBC wnl BMP glu 561, cr 1.53   ____________________________________________   EKG  None  ____________________________________________    RADIOLOGY  None  ____________________________________________   PROCEDURES  Procedures  ____________________________________________   INITIAL IMPRESSION / ASSESSMENT AND PLAN /  ED COURSE  Pertinent labs & imaging results that were available during my care of the patient were reviewed by me and considered in my medical decision making (see chart for details).  Patient presented from living facility today because of concern for elevated blood sugar. Concern would be for DKA however patient without elevated anion gap on blood work. Urine did have some findings consistent with infection. Do wonder if that could be the cause of the patient's elevated glucose. The patient's sugars did improve after IVFs. Will give first dose of antibiotic in the emergency department. Discussed findings with daughter. Will discharge back to living facility.   ____________________________________________   FINAL CLINICAL IMPRESSION(S) / ED DIAGNOSES  Final diagnoses:  Hyperglycemia  Lower urinary tract infectious disease     Note: This dictation was prepared with Dragon dictation. Any transcriptional errors that result from this process are unintentional     Phineas SemenGoodman, Tenesia Escudero, MD 12/09/17 2326

## 2017-12-09 NOTE — ED Notes (Signed)
Pt ambulatory to toilet in hllway with this RN without complications

## 2017-12-09 NOTE — ED Notes (Signed)
Family at bedside. 

## 2017-12-09 NOTE — ED Notes (Addendum)
EDP informed of CBG. Pt deneis needs a tthis time. Pt is resting comfortably at this time with no signs of distress present. VS stable. Will continue to monitor.

## 2017-12-09 NOTE — ED Triage Notes (Signed)
Pt bib [facility] from [facility] d/t hyperglycemia. Blood work at PCP today reported glucose over 600, CBG 592 in route with EMS. #22 gauge right hand with 500 ml saline infusing upon arrival.

## 2017-12-10 NOTE — ED Notes (Signed)

## 2017-12-10 NOTE — ED Notes (Signed)
Report to [employer] Med Tech at [facility]. States pt must be transported by EMS d/t family request.

## 2017-12-11 LAB — URINE CULTURE: CULTURE: NO GROWTH

## 2017-12-12 ENCOUNTER — Encounter: Payer: Self-pay | Admitting: Emergency Medicine

## 2017-12-12 ENCOUNTER — Emergency Department
Admission: EM | Admit: 2017-12-12 | Discharge: 2017-12-13 | Disposition: A | Discharge status: Home / Self Care | Payer: Medicare Other | Attending: Emergency Medicine | Admitting: Emergency Medicine

## 2017-12-12 ENCOUNTER — Other Ambulatory Visit: Payer: Self-pay

## 2017-12-12 DIAGNOSIS — R739 Hyperglycemia, unspecified: Secondary | ICD-10-CM | POA: Diagnosis present

## 2017-12-12 DIAGNOSIS — N183 Chronic kidney disease, stage 3 (moderate): Secondary | ICD-10-CM | POA: Diagnosis not present

## 2017-12-12 DIAGNOSIS — Z7901 Long term (current) use of anticoagulants: Secondary | ICD-10-CM | POA: Diagnosis not present

## 2017-12-12 DIAGNOSIS — E1165 Type 2 diabetes mellitus with hyperglycemia: Secondary | ICD-10-CM | POA: Diagnosis not present

## 2017-12-12 DIAGNOSIS — Z79899 Other long term (current) drug therapy: Secondary | ICD-10-CM | POA: Insufficient documentation

## 2017-12-12 DIAGNOSIS — I129 Hypertensive chronic kidney disease with stage 1 through stage 4 chronic kidney disease, or unspecified chronic kidney disease: Secondary | ICD-10-CM | POA: Insufficient documentation

## 2017-12-12 LAB — BASIC METABOLIC PANEL
Anion gap: 7 (ref 5–15)
BUN: 16 mg/dL (ref 6–20)
CALCIUM: 8.8 mg/dL — AB (ref 8.9–10.3)
CHLORIDE: 102 mmol/L (ref 101–111)
CO2: 22 mmol/L (ref 22–32)
CREATININE: 1.02 mg/dL — AB (ref 0.44–1.00)
GFR calc Af Amer: 56 mL/min — ABNORMAL LOW (ref >60)
GFR calc non Af Amer: 49 mL/min — ABNORMAL LOW (ref >60)
GLUCOSE: 482 mg/dL — AB (ref 65–99)
Potassium: 4.4 mmol/L (ref 3.5–5.1)
Sodium: 131 mmol/L — ABNORMAL LOW (ref 135–145)

## 2017-12-12 LAB — URINALYSIS, COMPLETE (UACMP) WITH MICROSCOPIC
Bacteria, UA: NONE SEEN
Bilirubin Urine: NEGATIVE
HGB URINE DIPSTICK: NEGATIVE
Ketones, ur: 5 mg/dL — AB
Leukocytes, UA: NEGATIVE
Nitrite: NEGATIVE
PROTEIN: NEGATIVE mg/dL
SPECIFIC GRAVITY, URINE: 1.02 (ref 1.005–1.030)
pH: 7 (ref 5.0–8.0)

## 2017-12-12 LAB — CBC
HCT: 35.8 % (ref 35.0–47.0)
Hemoglobin: 11.6 g/dL — ABNORMAL LOW (ref 12.0–16.0)
MCH: 30.6 pg (ref 26.0–34.0)
MCHC: 32.3 g/dL (ref 32.0–36.0)
MCV: 94.7 fL (ref 80.0–100.0)
PLATELETS: 197 10*3/uL (ref 150–440)
RBC: 3.79 MIL/uL — AB (ref 3.80–5.20)
RDW: 14.1 % (ref 11.5–14.5)
WBC: 7.1 10*3/uL (ref 3.6–11.0)

## 2017-12-12 LAB — GLUCOSE, CAPILLARY: GLUCOSE-CAPILLARY: 451 mg/dL — AB (ref 65–99)

## 2017-12-12 MED ORDER — SODIUM CHLORIDE 0.9 % IV BOLUS (SEPSIS)
1000.0000 mL | Freq: Once | INTRAVENOUS | Status: AC
  Administered 2017-12-12: 1000 mL via INTRAVENOUS

## 2017-12-12 NOTE — ED Provider Notes (Signed)
[facility] Emergency Department Provider Note ____________________________________________   First MD Initiated Contact with Patient 12/12/17 2127     (approximate)  I have reviewed the triage vital signs and the nursing notes.   HISTORY  Chief Complaint Hyperglycemia    HPI Theresa Santos is a 83 y.o. female with past medical history of diabetes (not on insulin) and other PMH as noted below who presents with hyperglycemia, measured to the 400s-500s, unknown onset, and not associated with any significant symptoms.  Patient had a similar episode last week and was seen in the ED; the glucose was reduced with fluids and patient has been stable since.  Patient denies any recent illness or other acute complaints currently.  Past Medical History:  Diagnosis Date  . Chronic kidney disease   . Diabetes mellitus without complication (HCC)   . High cholesterol   . Hypertension   . TIA (transient ischemic attack)   . Vascular dementia   . Vitamin B12 deficiency     Patient Active Problem List   Diagnosis Date Noted  . Syncope 05/08/2017  . HTN, goal below 140/80 05/08/2017  . Knee effusion, left 11/14/2016  . Hemarthrosis 11/14/2016  . Cardiomyopathy (HCC) 11/14/2016  . Acute pulmonary embolism (HCC) 11/13/2016  . Metabolic acidosis 11/13/2016  . CKD (chronic kidney disease), stage III (HCC) 11/13/2016  . Hypokalemia 11/13/2016  . Elevated troponin 11/13/2016  . Leukocytosis 11/13/2016  . Thrombocytopenia (HCC) 11/13/2016  . Right leg pain 11/13/2016  . Fall 11/13/2016  . Acute respiratory failure with hypoxia (HCC) 11/11/2016    Past Surgical History:  Procedure Laterality Date  . Left knee surgery     > 20 years ago    Prior to Admission medications   Medication Sig Start Date End Date Taking? Authorizing Provider  apixaban (ELIQUIS) 5 MG TABS tablet Take 1 tablet (5 mg total) by mouth 2 (two) times daily. 11/20/16   Katharina CaperVaickute, Rima, MD    atorvastatin (LIPITOR) 10 MG tablet Take 10 mg by mouth daily.    [provider]  carvedilol (COREG) 3.125 MG tablet Take 1 tablet (3.125 mg total) by mouth 2 (two) times daily with a meal. 11/14/16   Katharina CaperVaickute, Rima, MD  cephALEXin (KEFLEX) 500 MG capsule Take 1 capsule (500 mg total) by mouth 3 (three) times daily. 12/09/17   Phineas SemenGoodman, Graydon, MD  dorzolamide-timolol (COSOPT) 22.3-6.8 MG/ML ophthalmic solution Place 1 drop into both eyes 2 (two) times daily.    [provider]  latanoprost (XALATAN) 0.005 % ophthalmic solution Place 1 drop into both eyes at bedtime.    [provider]  lisinopril (PRINIVIL,ZESTRIL) 5 MG tablet Take 5 mg by mouth daily.    [provider]  LORazepam (ATIVAN) 0.5 MG tablet Take 1 tablet (0.5 mg total) by mouth daily as needed for anxiety (agitation/sundowning). Patient taking differently: Take 0.5 mg by mouth every evening. As needed for anxiety or agitation 11/13/16   Katharina CaperVaickute, Rima, MD  Multiple Vitamin (THEREMS) TABS Take 1 tablet by mouth daily.    [provider]  trolamine salicylate (ASPERCREME) 10 % cream Apply topically 2 (two) times daily. Patient taking differently: Apply 1 application topically 2 (two) times daily as needed. To knees 11/14/16   Katharina CaperVaickute, Rima, MD  vitamin B-12 (CYANOCOBALAMIN) 1000 MCG tablet Take 1,000 mcg by mouth daily.    [provider]    Allergies Patient has no known allergies.  No family history on file.  Social History Social History  Tobacco Use  . Smoking status: Never Smoker  . Smokeless tobacco: Never Used  Substance Use Topics  . Alcohol use: No  . Drug use: No    Review of Systems  Constitutional: No fever/chills. Eyes: No redness. ENT: No sore throat. Cardiovascular: Denies chest pain. Respiratory: Denies shortness of breath. Gastrointestinal: No nausea, no vomiting.   Genitourinary: Negative for dysuria or frequency.  Musculoskeletal:  Negative for back pain. Skin: Negative for rash. Neurological: Negative for headache.   ____________________________________________   PHYSICAL EXAM:  VITAL SIGNS: ED Triage Vitals  Enc Vitals Group     BP 12/12/17 2100 (!) 185/100     Pulse Rate 12/12/17 2100 77     Resp --      Temp 12/12/17 2104 97.7 F (36.5 C)     Temp Source 12/12/17 2104 Oral     SpO2 12/12/17 2100 96 %     Weight --      Height --      Head Circumference --      Peak Flow --      Pain Score --      Pain Loc --      Pain Edu? --      Excl. in GC? --     Constitutional: Alert and oriented. Well appearing and in no acute distress. Eyes: Conjunctivae are normal.  Head: Atraumatic. Nose: No congestion/rhinnorhea. Mouth/Throat: Mucous membranes are slightly dry.   Neck: Normal range of motion.  Cardiovascular: Normal rate, regular rhythm. Grossly normal heart sounds.  Good peripheral circulation. Respiratory: Normal respiratory effort.  No retractions. Lungs CTAB. Gastrointestinal: Soft and nontender. No distention.  Genitourinary: No flank tenderness. Musculoskeletal: No lower extremity edema.  Extremities warm and well perfused.  Neurologic:  Normal speech and language. No gross focal neurologic deficits are appreciated.  Skin:  Skin is warm and dry. No rash noted. Psychiatric: Mood and affect are normal. Speech and behavior are normal.  ____________________________________________   LABS (all labs ordered are listed, but only abnormal results are displayed)  Labs Reviewed  BASIC METABOLIC PANEL - Abnormal; Notable for the following components:      Result Value   Sodium 131 (*)    Glucose, Bld 482 (*)    Creatinine, Ser 1.02 (*)    Calcium 8.8 (*)    GFR calc non Af Amer 49 (*)    GFR calc Af Amer 56 (*)    All other components within normal limits  CBC - Abnormal; Notable for the following components:   RBC 3.79 (*)    Hemoglobin 11.6 (*)    All other components within normal  limits  URINALYSIS, COMPLETE (UACMP) WITH MICROSCOPIC - Abnormal; Notable for the following components:   Color, Urine STRAW (*)    APPearance CLEAR (*)    Glucose, UA >=500 (*)    Ketones, ur 5 (*)    Squamous Epithelial / LPF 0-5 (*)    All other components within normal limits  GLUCOSE, CAPILLARY - Abnormal; Notable for the following components:   Glucose-Capillary 451 (*)    All other components within normal limits  CBG MONITORING, ED   ____________________________________________  EKG   ____________________________________________  RADIOLOGY    ____________________________________________   PROCEDURES  Procedure(s) performed: No    Critical Care performed: No ____________________________________________   INITIAL IMPRESSION / ASSESSMENT AND PLAN / ED COURSE  Pertinent labs & imaging results that were available during my care of the patient were reviewed by me and considered  in my medical decision making (see chart for details).  83 year old female with past medical history as noted above presents with hyperglycemia noted on labs at her facility, and otherwise asymptomatic.  Patient denies any acute complaints, although she she is mildly demented.  Review of past medical records in Epic reveals a visit to the ED 3 days ago for a similar presentation with elevated glucose.  Patient is not insulin-dependent and so was given fluids with satisfactory improvement in her glucose and was sent home.  She was also given antibiotic due to questionable findings of UTI on UA.  On exam, vital signs are normal except for hypertension, the patient is comfortable appearing, and the remainder the exam is unremarkable.  Presentation is consistent with hyperglycemia, of unclear cause.  Possibly not adequately treated the patient's current medications, or there may be medication compliance or timing issues.  Also consider infection; given that patient has no symptoms, most likely would  be UTI vs viral syndrome.  I have low suspicion for DKA given the patient's well appearance and normal vital signs, however will obtain labs and UA to rule this out.  We will give fluids to correct the glucose and reassess.  Anticipate discharge back to the facility if the glucose improves appropriately.     ----------------------------------------- 11:58 PM on 12/12/2017 -----------------------------------------  Patient's UA is negative.  Anion gap is not elevated.  There is no evidence of DKA.  We will repeat the glucose after fluids.  When sufficiently lowered, discharged back to her facility.  Patient signed out to Dr. Zenda AlpersWebster pending repeat glucose. ____________________________________________   FINAL CLINICAL IMPRESSION(S) / ED DIAGNOSES  Final diagnoses:  Hyperglycemia      NEW MEDICATIONS STARTED DURING THIS VISIT:  This SmartLink is deprecated. Use AVSMEDLIST instead to display the medication list for a patient.   Note:  This document was prepared using Dragon voice recognition software and may include unintentional dictation errors.    Dionne BucySiadecki, Estiven Kohan, MD 12/12/17 [phone]

## 2017-12-12 NOTE — ED Triage Notes (Signed)
Pt bib [facility] d/t hyperglycemia. Seen here last week for same, corrected with fluids. Pt is non insulin dependent diabetic.hx TIA, dementia.

## 2017-12-13 DIAGNOSIS — E1165 Type 2 diabetes mellitus with hyperglycemia: Secondary | ICD-10-CM | POA: Diagnosis not present

## 2017-12-13 LAB — GLUCOSE, CAPILLARY
GLUCOSE-CAPILLARY: 188 mg/dL — AB (ref 65–99)
Glucose-Capillary: 316 mg/dL — ABNORMAL HIGH (ref 65–99)
Glucose-Capillary: 355 mg/dL — ABNORMAL HIGH (ref 65–99)
Glucose-Capillary: 357 mg/dL — ABNORMAL HIGH (ref 65–99)

## 2017-12-13 MED ORDER — INSULIN ASPART 100 UNIT/ML ~~LOC~~ SOLN
8.0000 [IU] | Freq: Once | SUBCUTANEOUS | Status: AC
  Administered 2017-12-13: 8 [IU] via SUBCUTANEOUS

## 2017-12-13 MED ORDER — SODIUM CHLORIDE 0.9 % IV BOLUS (SEPSIS)
1000.0000 mL | Freq: Once | INTRAVENOUS | Status: AC
  Administered 2017-12-13: 1000 mL via INTRAVENOUS

## 2017-12-13 MED ORDER — INSULIN ASPART 100 UNIT/ML ~~LOC~~ SOLN
SUBCUTANEOUS | Status: AC
  Filled 2017-12-13: qty 1

## 2017-12-13 NOTE — ED Notes (Signed)
Pt waiting on transport back to faciltiy.

## 2017-12-13 NOTE — ED Notes (Signed)
Patient is resting comfortably at this time with no signs of distress present. Will continue to monitor.   

## 2017-12-13 NOTE — Discharge Instructions (Signed)
Please follow up with your primary care physician.

## 2017-12-13 NOTE — ED Notes (Signed)
Pt assist to toilet without complication  

## 2017-12-13 NOTE — ED Notes (Signed)
Patient is resting comfortably at this time with no signs of distress present. VS stable. Will continue to monitor.   

## 2017-12-13 NOTE — ED Notes (Signed)
Pt to [facility] via [facility]. NAD, VSS, Resp non-labored and equal. Pt ate lunch before leaving.

## 2017-12-13 NOTE — ED Provider Notes (Signed)
-----------------------------------------   5:05 AM on 12/13/2017 -----------------------------------------   Blood pressure (!) 155/68, pulse 75, temperature 97.7 F (36.5 C), temperature source Oral, resp. rate 17, SpO2 95 %.  Assuming care from Dr. Marisa SeverinSiadecki.  In short, Theresa Santos is a 83 y.o. female with a chief complaint of Hyperglycemia .  Refer to the original H&P for additional details.  The current plan of care is to to new monitoring the patient's sugars to assess for decrease below 300.  The patient received a second liter of normal saline but her blood sugars were still in the 350s.  I then gave her a dose of insulin subcutaneously 8 units and another 500 mL bolus of normal saline.  After all of the medication the patient's blood sugars came down into the 180s.  The patient will be discharged to follow-up with her primary care physician.        Rebecka ApleyWebster, Allison P, MD 12/13/17 [phone]

## 2017-12-15 ENCOUNTER — Emergency Department
Admission: EM | Admit: 2017-12-15 | Discharge: 2017-12-16 | Disposition: A | Discharge status: Home / Self Care | Payer: Medicare Other | Attending: Emergency Medicine | Admitting: Emergency Medicine

## 2017-12-15 DIAGNOSIS — Z79899 Other long term (current) drug therapy: Secondary | ICD-10-CM | POA: Insufficient documentation

## 2017-12-15 DIAGNOSIS — E1122 Type 2 diabetes mellitus with diabetic chronic kidney disease: Secondary | ICD-10-CM | POA: Insufficient documentation

## 2017-12-15 DIAGNOSIS — E1165 Type 2 diabetes mellitus with hyperglycemia: Secondary | ICD-10-CM | POA: Diagnosis not present

## 2017-12-15 DIAGNOSIS — N183 Chronic kidney disease, stage 3 (moderate): Secondary | ICD-10-CM | POA: Insufficient documentation

## 2017-12-15 DIAGNOSIS — Z8673 Personal history of transient ischemic attack (TIA), and cerebral infarction without residual deficits: Secondary | ICD-10-CM | POA: Diagnosis not present

## 2017-12-15 DIAGNOSIS — F0390 Unspecified dementia without behavioral disturbance: Secondary | ICD-10-CM | POA: Insufficient documentation

## 2017-12-15 DIAGNOSIS — I129 Hypertensive chronic kidney disease with stage 1 through stage 4 chronic kidney disease, or unspecified chronic kidney disease: Secondary | ICD-10-CM | POA: Diagnosis not present

## 2017-12-15 DIAGNOSIS — Z7901 Long term (current) use of anticoagulants: Secondary | ICD-10-CM | POA: Diagnosis not present

## 2017-12-15 DIAGNOSIS — R739 Hyperglycemia, unspecified: Secondary | ICD-10-CM

## 2017-12-15 MED ORDER — SODIUM CHLORIDE 0.9 % IV BOLUS (SEPSIS)
1000.0000 mL | Freq: Once | INTRAVENOUS | Status: AC
  Administered 2017-12-15: 1000 mL via INTRAVENOUS

## 2017-12-15 NOTE — ED Triage Notes (Signed)
Pt arrived via EMS from [facility] facility. They called out for her with complaints of a blood sugar of 431. Pt has no complaints of pain. VS per EMS BP-160/100 HR-87 BS-431. Facility told EMS to let ED know that they are trying to get orders for sliding scale insulin for the patient.

## 2017-12-16 DIAGNOSIS — E1165 Type 2 diabetes mellitus with hyperglycemia: Secondary | ICD-10-CM | POA: Diagnosis not present

## 2017-12-16 LAB — URINALYSIS, COMPLETE (UACMP) WITH MICROSCOPIC
Bilirubin Urine: NEGATIVE
Glucose, UA: >=500 mg/dL — AB
Hgb urine dipstick: NEGATIVE
KETONES UR: NEGATIVE mg/dL
Leukocytes, UA: NEGATIVE
Nitrite: NEGATIVE
PH: 6 (ref 5.0–8.0)
Protein, ur: NEGATIVE mg/dL
SPECIFIC GRAVITY, URINE: 1.017 (ref 1.005–1.030)

## 2017-12-16 LAB — BASIC METABOLIC PANEL
Anion gap: 7 (ref 5–15)
BUN: 14 mg/dL (ref 6–20)
CHLORIDE: 100 mmol/L — AB (ref 101–111)
CO2: 24 mmol/L (ref 22–32)
CREATININE: 1.09 mg/dL — AB (ref 0.44–1.00)
Calcium: 8.4 mg/dL — ABNORMAL LOW (ref 8.9–10.3)
GFR calc Af Amer: 52 mL/min — ABNORMAL LOW (ref >60)
GFR calc non Af Amer: 45 mL/min — ABNORMAL LOW (ref >60)
GLUCOSE: 379 mg/dL — AB (ref 65–99)
Potassium: 4.3 mmol/L (ref 3.5–5.1)
Sodium: 131 mmol/L — ABNORMAL LOW (ref 135–145)

## 2017-12-16 LAB — CBC
HEMATOCRIT: 36.8 % (ref 35.0–47.0)
Hemoglobin: 12.1 g/dL (ref 12.0–16.0)
MCH: 30.7 pg (ref 26.0–34.0)
MCHC: 33 g/dL (ref 32.0–36.0)
MCV: 93.2 fL (ref 80.0–100.0)
Platelets: 213 10*3/uL (ref 150–440)
RBC: 3.94 MIL/uL (ref 3.80–5.20)
RDW: 14.4 % (ref 11.5–14.5)
WBC: 6.9 10*3/uL (ref 3.6–11.0)

## 2017-12-16 LAB — GLUCOSE, CAPILLARY: Glucose-Capillary: 238 mg/dL — ABNORMAL HIGH (ref 65–99)

## 2017-12-16 MED ORDER — INSULIN ASPART 100 UNIT/ML ~~LOC~~ SOLN
6.0000 [IU] | Freq: Once | SUBCUTANEOUS | Status: AC
  Administered 2017-12-16: 6 [IU] via SUBCUTANEOUS
  Filled 2017-12-16: qty 1

## 2017-12-16 NOTE — ED Notes (Signed)
Patient's discharge and follow up information reviewed with patient by ED nursing staff and patient given the opportunity to ask questions pertaining to ED visit and discharge plan of care. Patient advised that should symptoms not continue to improve, resolve entirely, or should new symptoms develop then a follow up visit with their PCP or a return visit to the ED may be warranted. Patient verbalized consent and understanding of discharge plan of care including potential need for further evaluation. Patient discharged in stable condition per attending ED physician on duty.   [facility] present to transport pt back to [facility].

## 2017-12-16 NOTE — ED Provider Notes (Signed)
[facility] Emergency Department Provider Note   ____________________________________________   First MD Initiated Contact with Patient 12/15/17 2317     (approximate)  I have reviewed the triage vital signs and the nursing notes.   HISTORY  Chief Complaint Hyperglycemia  Patient with history of dementia so limited.  HPI Theresa Santos is a 83 y.o. female who comes into the hospital today with high blood sugars.  EMS states that they are trying to get orders for sliding scale insulin.  The patient denies any pain or any complaints.  She states that she thinks she is here just about her sugars.  She says that her blood sugars were high in the 2 or 300s but we have a documented blood sugar of 431 by EMS.  The patient denies any belly pain, nausea, vomiting, pain with urination.  She reports that she feels well and they just sent her here to help fix her blood sugars.  The patient will be evaluated.  Past Medical History:  Diagnosis Date  . Chronic kidney disease   . Diabetes mellitus without complication (HCC)   . High cholesterol   . Hypertension   . TIA (transient ischemic attack)   . Vascular dementia   . Vitamin B12 deficiency     Patient Active Problem List   Diagnosis Date Noted  . Syncope 05/08/2017  . HTN, goal below 140/80 05/08/2017  . Knee effusion, left 11/14/2016  . Hemarthrosis 11/14/2016  . Cardiomyopathy (HCC) 11/14/2016  . Acute pulmonary embolism (HCC) 11/13/2016  . Metabolic acidosis 11/13/2016  . CKD (chronic kidney disease), stage III (HCC) 11/13/2016  . Hypokalemia 11/13/2016  . Elevated troponin 11/13/2016  . Leukocytosis 11/13/2016  . Thrombocytopenia (HCC) 11/13/2016  . Right leg pain 11/13/2016  . Fall 11/13/2016  . Acute respiratory failure with hypoxia (HCC) 11/11/2016    Past Surgical History:  Procedure Laterality Date  . Left knee surgery     > 20 years ago    Prior to Admission medications     Medication Sig Start Date End Date Taking? Authorizing Provider  apixaban (ELIQUIS) 5 MG TABS tablet Take 1 tablet (5 mg total) by mouth 2 (two) times daily. 11/20/16   Katharina Caper, MD  atorvastatin (LIPITOR) 10 MG tablet Take 10 mg by mouth daily.    [provider]  carvedilol (COREG) 3.125 MG tablet Take 1 tablet (3.125 mg total) by mouth 2 (two) times daily with a meal. 11/14/16   Katharina Caper, MD  cephALEXin (KEFLEX) 500 MG capsule Take 1 capsule (500 mg total) by mouth 3 (three) times daily. 12/09/17   Phineas Semen, MD  dorzolamide-timolol (COSOPT) 22.3-6.8 MG/ML ophthalmic solution Place 1 drop into both eyes 2 (two) times daily.    [provider]  latanoprost (XALATAN) 0.005 % ophthalmic solution Place 1 drop into both eyes at bedtime.    [provider]  lisinopril (PRINIVIL,ZESTRIL) 5 MG tablet Take 5 mg by mouth daily.    [provider]  LORazepam (ATIVAN) 0.5 MG tablet Take 1 tablet (0.5 mg total) by mouth daily as needed for anxiety (agitation/sundowning). Patient taking differently: Take 0.5 mg by mouth every evening. As needed for anxiety or agitation 11/13/16   Katharina Caper, MD  Multiple Vitamin (THEREMS) TABS Take 1 tablet by mouth daily.    [provider]  trolamine salicylate (ASPERCREME) 10 % cream Apply topically 2 (two) times daily. Patient taking differently: Apply 1 application topically 2 (two) times daily as needed.  To knees 11/14/16   Katharina CaperVaickute, Rima, MD  vitamin B-12 (CYANOCOBALAMIN) 1000 MCG tablet Take 1,000 mcg by mouth daily.    [provider]    Allergies Patient has no known allergies.  History reviewed. No pertinent family history.  Social History Social History   Tobacco Use  . Smoking status: Never Smoker  . Smokeless tobacco: Never Used  Substance Use Topics  . Alcohol use: No  . Drug use: No    Review of Systems  Constitutional: High blood sugars Eyes: No visual  changes. ENT: No sore throat. Cardiovascular: Denies chest pain. Respiratory: Denies shortness of breath. Gastrointestinal: No abdominal pain.  No nausea, no vomiting.  No diarrhea.  No constipation. Genitourinary: Negative for dysuria. Musculoskeletal: Negative for back pain. Skin: Negative for rash. Neurological: Negative for headaches, focal weakness or numbness.   ____________________________________________   PHYSICAL EXAM:  VITAL SIGNS: ED Triage Vitals  Enc Vitals Group     BP 12/15/17 2237 (!) 156/82     Pulse Rate 12/15/17 2237 82     Resp 12/15/17 2237 18     Temp 12/15/17 2237 98.2 F (36.8 C)     Temp Source 12/15/17 2237 Oral     SpO2 12/15/17 2237 99 %     Weight 12/15/17 2240 129 lb (58.5 kg)     Height 12/15/17 2240 5\' 6"  (1.676 m)     Head Circumference --      Peak Flow --      Pain Score --      Pain Loc --      Pain Edu? --      Excl. in GC? --     Constitutional: Alert and oriented to self and location. Well appearing and in no acute distress. Eyes: Conjunctivae are normal. PERRL. EOMI. Head: Atraumatic. Nose: No congestion/rhinnorhea. Mouth/Throat: Mucous membranes are moist.  Oropharynx non-erythematous. Cardiovascular: Normal rate, regular rhythm. Grossly normal heart sounds.  Good peripheral circulation. Respiratory: Normal respiratory effort.  No retractions. Lungs CTAB. Gastrointestinal: Soft and nontender. No distention. No abdominal bruits.  Musculoskeletal: No lower extremity tenderness nor edema.   Neurologic:  Normal speech and language.  Skin:  Skin is warm, dry and intact.  Psychiatric: Mood and affect are normal.  ____________________________________________   LABS (all labs ordered are listed, but only abnormal results are displayed)  Labs Reviewed  BASIC METABOLIC PANEL - Abnormal; Notable for the following components:      Result Value   Sodium 131 (*)    Chloride 100 (*)    Glucose, Bld 379 (*)    Creatinine, Ser 1.09  (*)    Calcium 8.4 (*)    GFR calc non Af Amer 45 (*)    GFR calc Af Amer 52 (*)    All other components within normal limits  GLUCOSE, CAPILLARY - Abnormal; Notable for the following components:   Glucose-Capillary 238 (*)    All other components within normal limits  URINALYSIS, COMPLETE (UACMP) WITH MICROSCOPIC - Abnormal; Notable for the following components:   Color, Urine STRAW (*)    APPearance CLEAR (*)    Glucose, UA >=500 (*)    Bacteria, UA RARE (*)    Squamous Epithelial / LPF 0-5 (*)    All other components within normal limits  CBC  CBG MONITORING, ED   ____________________________________________  EKG  none ____________________________________________  RADIOLOGY  No results found.  ____________________________________________   PROCEDURES  Procedure(s) performed: None  Procedures  Critical Care performed: No  ____________________________________________   INITIAL IMPRESSION / ASSESSMENT AND PLAN / ED COURSE  As part of my medical decision making, I reviewed the following data within the electronic MEDICAL RECORD NUMBER Notes from prior ED visits and [place] Controlled Substance Database   This is an 83 year old female who comes into the hospital today with high blood sugars.  We did get in touch with the nursing home and they state that after they contacted the physician on call covering for Dr. saying that he told her the patient needed to come to get placed on sliding scale insulin.  The staff who we spoke with said that the patient is not on any diabetes medications and does not get fingersticks regularly.  I initially checked a BMP on the patient.  Her sodium was 131 and her glucose was 739.  I ordered a liter of normal saline and 6 units of subcutaneous insulin.  The patient received the medication and after about a 500 mL bolus her blood sugars returned at 238.  I looked through the patient's notes and saw the telephone conversation from Dr. Graciela HusbandsKlein.  In  the conversation there was a concern about some decreased level of consciousness as well as refusal to take antibiotics.  He is concerned that the patient may be having some infection.  I then checked a CBC and a urinalysis which were also negative.  There was never a report of decreased level of consciousness and the patient has been awake and answering questions appropriately since she arrived in the emergency department.  I also looked back at the notes and it appears that the patient was ordered Amaryl to start on 14 December.  I am unsure if the patient has started taking this medication.  I feel it more appropriate for the patient's primary care physician to attempt to use oral medications to control her diabetes or to start the patient on insulin versus starting her on a sliding scale.  According to a note from December 14 the patient has a hemoglobin A1c of 15.9.  The patient needs to be placed on appropriate control medications.  We will encourage the nursing home to contact her primary care physician and start her medications. The patient will be discharged to follow up with her primary care physician.      ____________________________________________   FINAL CLINICAL IMPRESSION(S) / ED DIAGNOSES  Final diagnoses:  Hyperglycemia     ED Discharge Orders    None       Note:  This document was prepared using Dragon voice recognition software and may include unintentional dictation errors.    Rebecka ApleyWebster, Allison P, MD 12/16/17 [phone]

## 2017-12-16 NOTE — Discharge Instructions (Signed)
Please ensure that the patient is taking the medication prescribed by Dr. Thedore MinsSingh.  Also please get in touch with Dr. Thedore MinsSingh for further orders regarding the patient's elevated blood sugars.  Please continue monitoring the patient's blood sugars before she eats and before she goes to sleep.

## 2017-12-16 NOTE — ED Notes (Signed)
Report given to Sarajane Marekracy Graves at [facility]. Discharge instructions and education provided.

## 2017-12-21 IMAGING — CT CT HEAD W/O CM
5 of 8 series · 16 of 47 positions shown, 17 images · non-contrast
Comparison: CT head 07/04/2017

CLINICAL DATA: Fall with head injury. History of Alzheimer's.
Dizziness.

EXAM:
CT HEAD WITHOUT CONTRAST
CT CERVICAL SPINE WITHOUT CONTRAST
TECHNIQUE: Multidetector CT imaging of the head and cervical spine was
performed following the standard protocol without intravenous
contrast. Multiplanar CT image reconstructions of the cervical spine
were also generated.

[Series 3: head wo · axial · 0.41mm/px · z∈[-56,-6]mm · 2 of 30 slices shown, 3 images]
[im 10/30  brain]
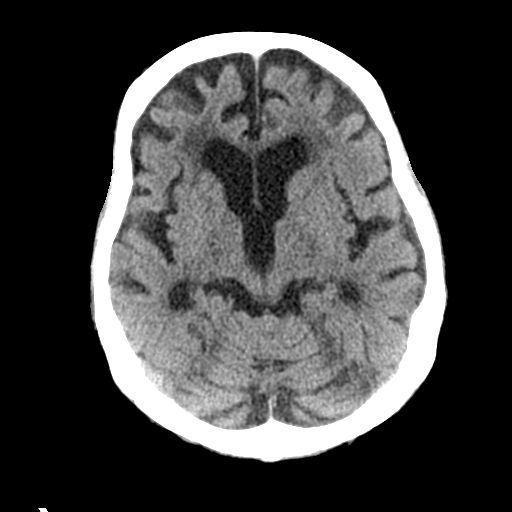
[im 10/30  bone]
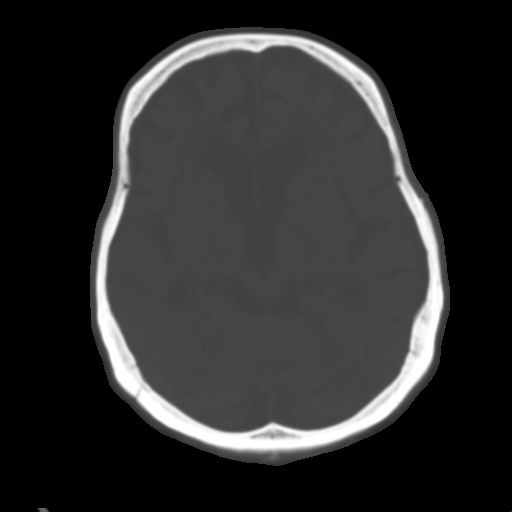
[im 20/30  brain]
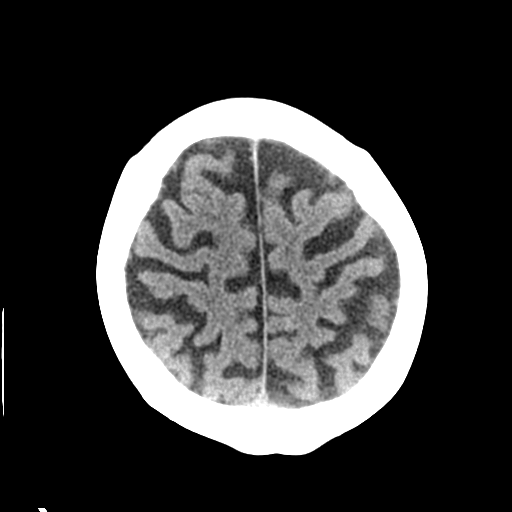

[Series 4: head bone · axial · 0.41mm/px · z∈[-81,-61]mm · 2 of 74 slices shown]
[im 11/74  bone]
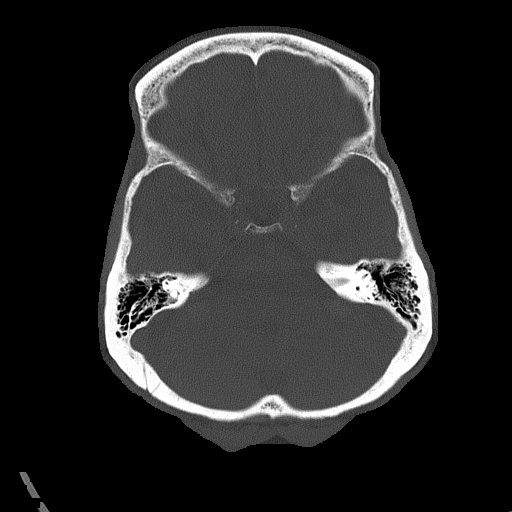
[im 21/74  bone]
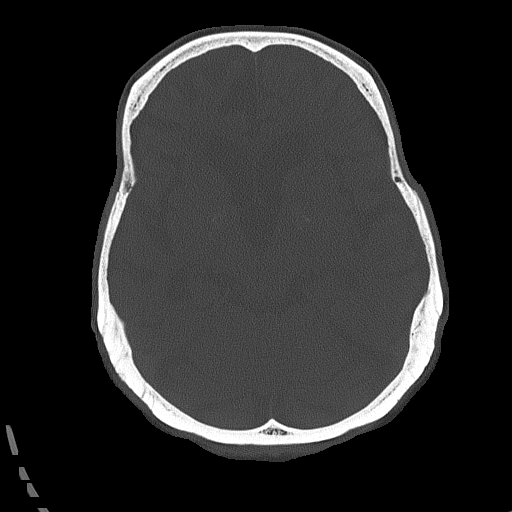

[Series 5: coronal soft tissue · coronal · 0.29mm/px · 3 of 60 slices shown]
[im 23/60  brain]
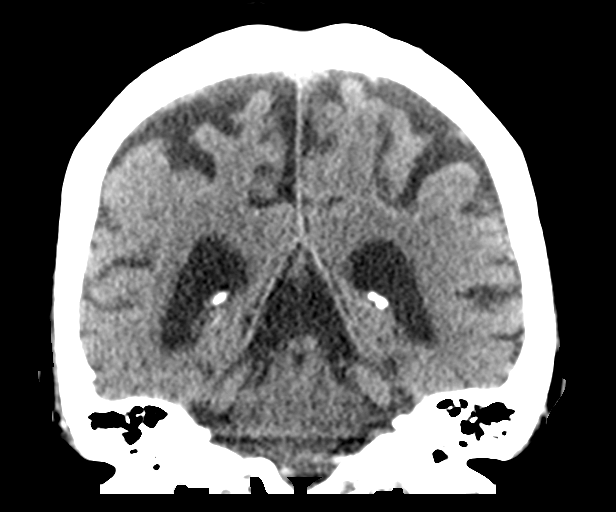
[im 30/60  brain]
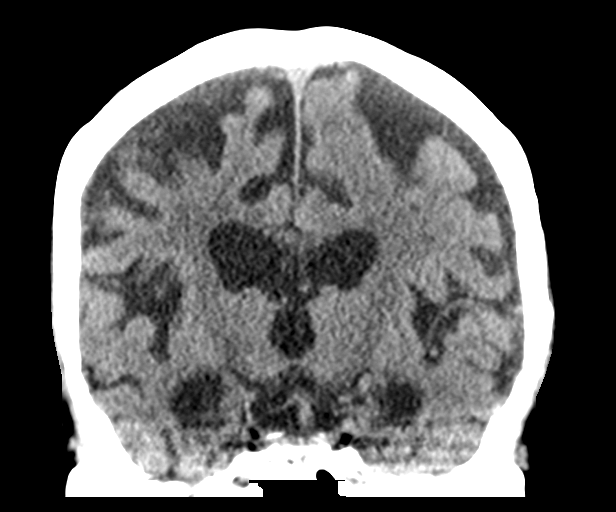
[im 37/60  brain]
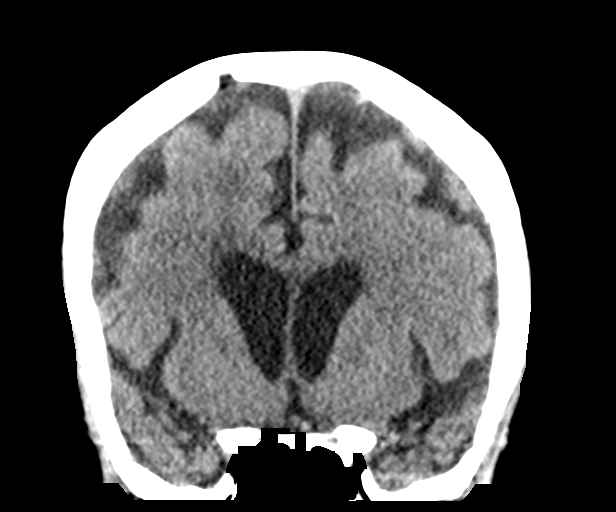

[Series 6: sagittal soft tissue · sagittal · 0.29mm/px · 2 of 50 slices shown]
[im 17/50  brain]
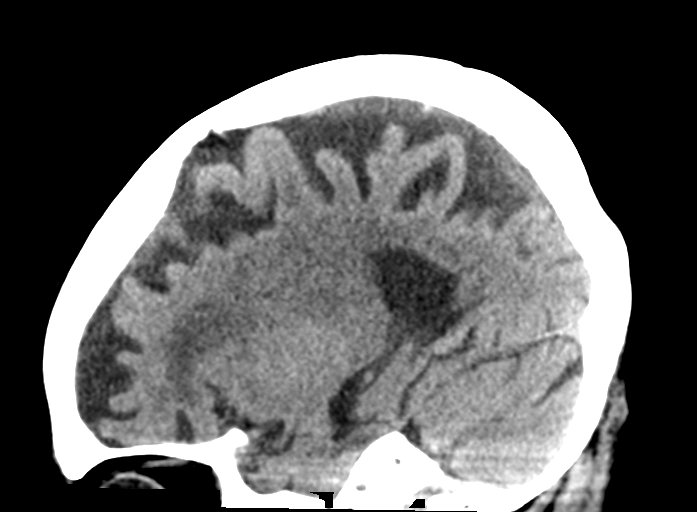
[im 33/50  brain]
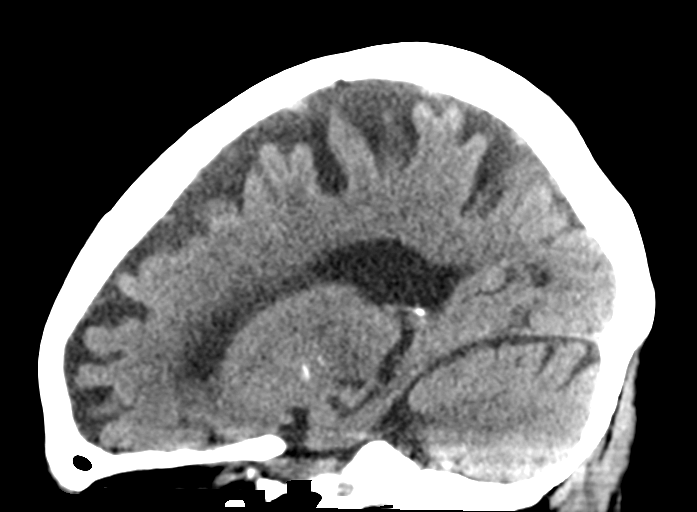

[Series 10: orthogonal bone · axial · 0.23mm/px · z∈[-237,-129]mm · 7 of 79 slices shown]
[im 10/79  bone]
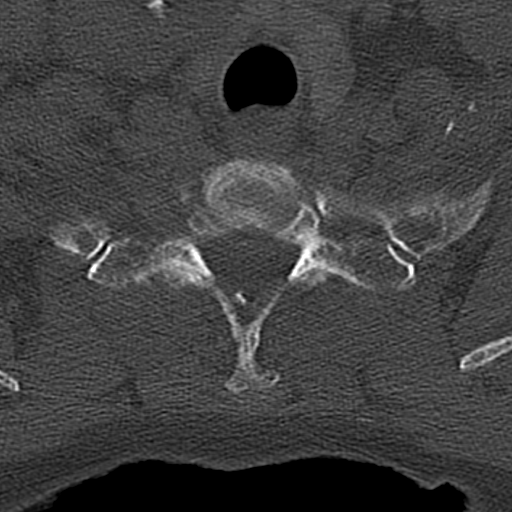
[im 20/79  bone]
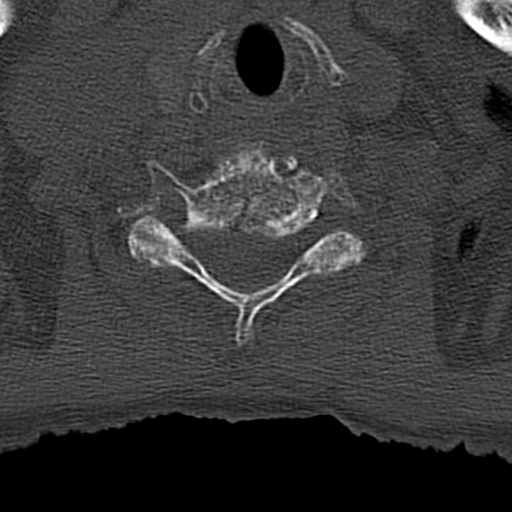
[im 30/79  bone]
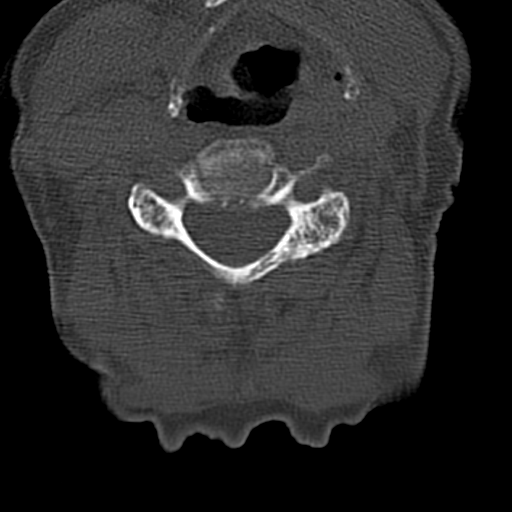
[im 40/79  bone]
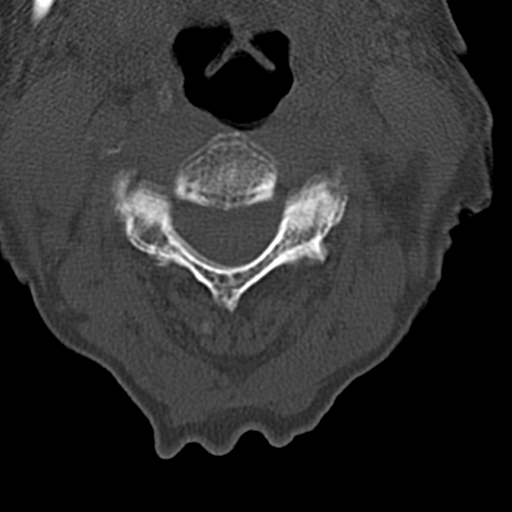
[im 49/79  bone]
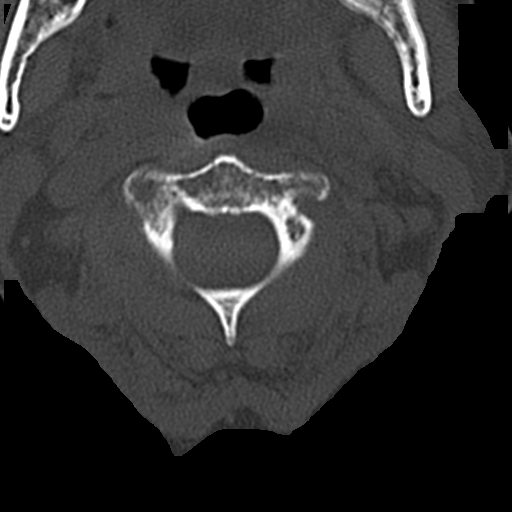
[im 59/79  bone]
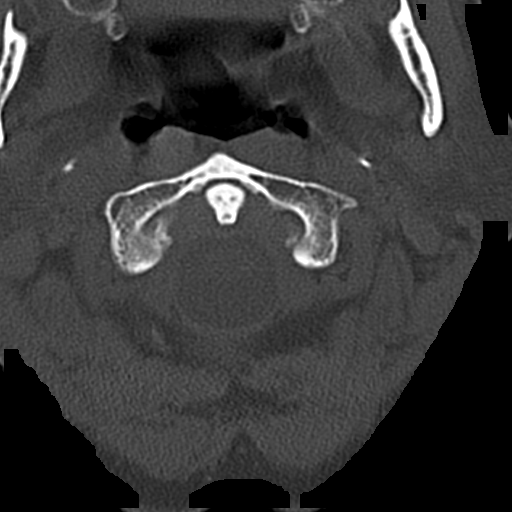
[im 69/79  bone]
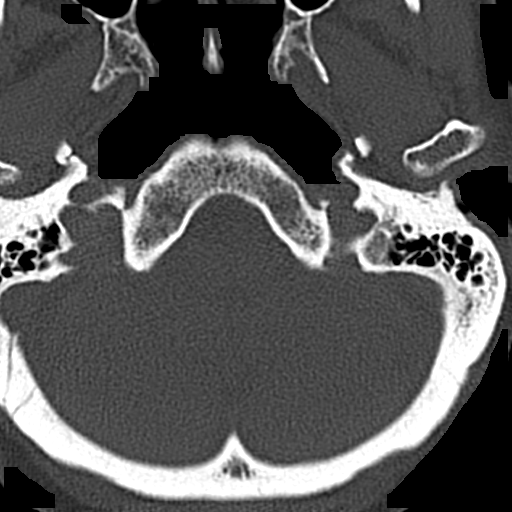

[16 of 47 positions shown; findings below may reference images not displayed]

FINDINGS: CT HEAD FINDINGS

Brain: Diffuse cerebral atrophy. Ventricular dilatation consistent
with central atrophy. Low-attenuation changes in the deep white
matter consistent small vessel ischemia. Basal ganglia
calcifications. No mass effect or midline shift. No abnormal
extra-axial fluid collections. Gray-white matter junctions are
distinct. Basal cisterns are not effaced. No acute intracranial
hemorrhage.

Vascular: Vascular calcifications in the internal carotid arteries.

Skull: No depressed skull fractures.

Sinuses/Orbits: No acute finding.

Other: None.

CT CERVICAL SPINE FINDINGS

Alignment: Examination is limited by motion artifact. There is
slight retrolisthesis of C3 on C4 and mild anterolisthesis of C5 on
C6. Mild retrolisthesis of C6 on C7. These changes may be
degenerative but ligamentous injury could also have this appearance
and is not excluded. Normal alignment of the facet joints. C1-2
articulation appears intact.

Skull base and vertebrae: No vertebral compression deformities. No
focal bone lesion or bone destruction.

Soft tissues and spinal canal: No prevertebral soft tissue swelling.
No paraspinal infiltration or edema.

Disc levels: Diffuse degenerative changes with narrowed interspaces
and endplate hypertrophic changes throughout. Degenerative changes
are most severe at C5-6 and C6-7 levels. Degenerative changes
throughout the cervical facet joints.

Upper chest: Nodule in the left lung apex measuring 4.6 cm diameter.
This is likely benign in a low risk patient.

Other: Vascular calcifications.
IMPRESSION: 1. No acute intracranial abnormalities. Chronic atrophy and small
vessel ischemic changes.
2. Mild anterior subluxation at C5-6 with mild retrolisthesis of C3
on C4 and C6 on C7. Changes may be degenerative but ligamentous
injury is not excluded. If patient has symptoms related to this
area, MRI may be useful in further evaluation. Diffuse degenerative
changes. No displaced fractures identified.

## 2018-10-02 ENCOUNTER — Emergency Department: Payer: Medicare Other

## 2018-10-02 ENCOUNTER — Other Ambulatory Visit: Payer: Self-pay

## 2018-10-02 ENCOUNTER — Emergency Department
Admission: EM | Admit: 2018-10-02 | Discharge: 2018-10-02 | Disposition: A | Discharge status: Skilled Nursing Facility | Payer: Medicare Other | Attending: Emergency Medicine | Admitting: Emergency Medicine

## 2018-10-02 DIAGNOSIS — Y9301 Activity, walking, marching and hiking: Secondary | ICD-10-CM | POA: Diagnosis not present

## 2018-10-02 DIAGNOSIS — E1122 Type 2 diabetes mellitus with diabetic chronic kidney disease: Secondary | ICD-10-CM | POA: Insufficient documentation

## 2018-10-02 DIAGNOSIS — I129 Hypertensive chronic kidney disease with stage 1 through stage 4 chronic kidney disease, or unspecified chronic kidney disease: Secondary | ICD-10-CM | POA: Diagnosis not present

## 2018-10-02 DIAGNOSIS — Y999 Unspecified external cause status: Secondary | ICD-10-CM | POA: Insufficient documentation

## 2018-10-02 DIAGNOSIS — N183 Chronic kidney disease, stage 3 (moderate): Secondary | ICD-10-CM | POA: Diagnosis not present

## 2018-10-02 DIAGNOSIS — N390 Urinary tract infection, site not specified: Secondary | ICD-10-CM | POA: Diagnosis not present

## 2018-10-02 DIAGNOSIS — Y92129 Unspecified place in nursing home as the place of occurrence of the external cause: Secondary | ICD-10-CM | POA: Insufficient documentation

## 2018-10-02 DIAGNOSIS — W19XXXA Unspecified fall, initial encounter: Secondary | ICD-10-CM

## 2018-10-02 DIAGNOSIS — Z79899 Other long term (current) drug therapy: Secondary | ICD-10-CM | POA: Diagnosis not present

## 2018-10-02 DIAGNOSIS — R51 Headache: Secondary | ICD-10-CM | POA: Insufficient documentation

## 2018-10-02 DIAGNOSIS — Z7901 Long term (current) use of anticoagulants: Secondary | ICD-10-CM | POA: Diagnosis not present

## 2018-10-02 DIAGNOSIS — S0993XA Unspecified injury of face, initial encounter: Secondary | ICD-10-CM | POA: Diagnosis present

## 2018-10-02 DIAGNOSIS — S0083XA Contusion of other part of head, initial encounter: Secondary | ICD-10-CM | POA: Diagnosis not present

## 2018-10-02 DIAGNOSIS — S0990XA Unspecified injury of head, initial encounter: Secondary | ICD-10-CM

## 2018-10-02 LAB — COMPREHENSIVE METABOLIC PANEL
ALK PHOS: 88 U/L (ref 38–126)
ALT: 22 U/L (ref 0–44)
AST: 26 U/L (ref 15–41)
Albumin: 4.6 g/dL (ref 3.5–5.0)
Anion gap: 5 (ref 5–15)
BILIRUBIN TOTAL: 0.8 mg/dL (ref 0.3–1.2)
BUN: 25 mg/dL — AB (ref 8–23)
CALCIUM: 9.5 mg/dL (ref 8.9–10.3)
CO2: 28 mmol/L (ref 22–32)
CREATININE: 1.22 mg/dL — AB (ref 0.44–1.00)
Chloride: 104 mmol/L (ref 98–111)
GFR calc Af Amer: 45 mL/min — ABNORMAL LOW (ref >60)
GFR calc non Af Amer: 39 mL/min — ABNORMAL LOW (ref >60)
Glucose, Bld: 148 mg/dL — ABNORMAL HIGH (ref 70–99)
Potassium: 4.5 mmol/L (ref 3.5–5.1)
Sodium: 137 mmol/L (ref 135–145)
TOTAL PROTEIN: 7.8 g/dL (ref 6.5–8.1)

## 2018-10-02 LAB — URINALYSIS, COMPLETE (UACMP) WITH MICROSCOPIC
BILIRUBIN URINE: NEGATIVE
Glucose, UA: NEGATIVE mg/dL
Hgb urine dipstick: NEGATIVE
Ketones, ur: NEGATIVE mg/dL
Leukocytes, UA: NEGATIVE
Nitrite: NEGATIVE
Protein, ur: NEGATIVE mg/dL
SPECIFIC GRAVITY, URINE: 1.012 (ref 1.005–1.030)
pH: 7 (ref 5.0–8.0)

## 2018-10-02 LAB — CBC WITH DIFFERENTIAL/PLATELET
Basophils Absolute: 0.1 10*3/uL (ref 0–0.1)
Basophils Relative: 1 %
Eosinophils Absolute: 0.1 10*3/uL (ref 0–0.7)
Eosinophils Relative: 1 %
HEMATOCRIT: 39.7 % (ref 35.0–47.0)
Hemoglobin: 13.2 g/dL (ref 12.0–16.0)
LYMPHS ABS: 2.3 10*3/uL (ref 1.0–3.6)
Lymphocytes Relative: 21 %
MCH: 30.1 pg (ref 26.0–34.0)
MCHC: 33.3 g/dL (ref 32.0–36.0)
MCV: 90.2 fL (ref 80.0–100.0)
MONO ABS: 0.7 10*3/uL (ref 0.2–0.9)
MONOS PCT: 7 %
NEUTROS ABS: 8 10*3/uL — AB (ref 1.4–6.5)
Neutrophils Relative %: 72 %
Platelets: 210 10*3/uL (ref 150–440)
RBC: 4.4 MIL/uL (ref 3.80–5.20)
RDW: 14.4 % (ref 11.5–14.5)
WBC: 11.2 10*3/uL — ABNORMAL HIGH (ref 3.6–11.0)

## 2018-10-02 LAB — TROPONIN I: Troponin I: <0.03 ng/mL (ref <0.03)

## 2018-10-02 MED ORDER — CEPHALEXIN 500 MG PO CAPS
500.0000 mg | ORAL_CAPSULE | Freq: Once | ORAL | Status: AC
  Administered 2018-10-02: 500 mg via ORAL
  Filled 2018-10-02: qty 1

## 2018-10-02 MED ORDER — CEPHALEXIN 500 MG PO CAPS: 500.0000 mg | ORAL_CAPSULE | Freq: Two times a day (BID) | ORAL | 0 refills | Status: AC

## 2018-10-02 NOTE — ED Notes (Signed)
Report given to christy at [facility]. All questions answered

## 2018-10-02 NOTE — ED Provider Notes (Signed)
[facility] [place] Emergency Department Provider Note  ___________________________________________   First MD Initiated Contact with Patient 10/02/18 [phone]     (approximate)  I have reviewed the triage vital signs and the nursing notes.   HISTORY  Chief Complaint Fall and Head Injury   HPI Theresa Santos is a 84 y.o. female with a history of chronic kidney disease, diabetes as well as hypertension and TIA on Eliquis who was presented to the emergency department today after a fall.  She says that she lost her balance while walking today and fell flat on her face.  She denies loss of consciousness.  Says that she did not trip.  Denies passing out.  Denies any pain at this time except her forehead where she was noted to have a large hematoma by EMS.  Past Medical History:  Diagnosis Date  . Chronic kidney disease   . Diabetes mellitus without complication (HCC)   . High cholesterol   . Hypertension   . TIA (transient ischemic attack)   . Vascular dementia (HCC)   . Vitamin B12 deficiency     Patient Active Problem List   Diagnosis Date Noted  . Syncope 05/08/2017  . HTN, goal below 140/80 05/08/2017  . Knee effusion, left 11/14/2016  . Hemarthrosis 11/14/2016  . Cardiomyopathy (HCC) 11/14/2016  . Acute pulmonary embolism (HCC) 11/13/2016  . Metabolic acidosis 11/13/2016  . CKD (chronic kidney disease), stage III (HCC) 11/13/2016  . Hypokalemia 11/13/2016  . Elevated troponin 11/13/2016  . Leukocytosis 11/13/2016  . Thrombocytopenia (HCC) 11/13/2016  . Right leg pain 11/13/2016  . Fall 11/13/2016  . Acute respiratory failure with hypoxia (HCC) 11/11/2016    Past Surgical History:  Procedure Laterality Date  . Left knee surgery     > 20 years ago    Prior to Admission medications   Medication Sig Start Date End Date Taking? Authorizing Provider  apixaban (ELIQUIS) 5 MG TABS tablet Take 1 tablet (5 mg total) by mouth 2 (two) times daily.  11/20/16   Katharina Caper, MD  atorvastatin (LIPITOR) 10 MG tablet Take 10 mg by mouth daily.    [provider]  carvedilol (COREG) 3.125 MG tablet Take 1 tablet (3.125 mg total) by mouth 2 (two) times daily with a meal. 11/14/16   Katharina Caper, MD  cephALEXin (KEFLEX) 500 MG capsule Take 1 capsule (500 mg total) by mouth 3 (three) times daily. 12/09/17   Phineas Semen, MD  dorzolamide-timolol (COSOPT) 22.3-6.8 MG/ML ophthalmic solution Place 1 drop into both eyes 2 (two) times daily.    [provider]  latanoprost (XALATAN) 0.005 % ophthalmic solution Place 1 drop into both eyes at bedtime.    [provider]  lisinopril (PRINIVIL,ZESTRIL) 5 MG tablet Take 5 mg by mouth daily.    [provider]  LORazepam (ATIVAN) 0.5 MG tablet Take 1 tablet (0.5 mg total) by mouth daily as needed for anxiety (agitation/sundowning). Patient taking differently: Take 0.5 mg by mouth every evening. As needed for anxiety or agitation 11/13/16   Katharina Caper, MD  Multiple Vitamin (THEREMS) TABS Take 1 tablet by mouth daily.    [provider]  trolamine salicylate (ASPERCREME) 10 % cream Apply topically 2 (two) times daily. Patient taking differently: Apply 1 application topically 2 (two) times daily as needed. To knees 11/14/16   Katharina Caper, MD  vitamin B-12 (CYANOCOBALAMIN) 1000 MCG tablet Take 1,000 mcg by mouth daily.    [provider]    Allergies  Patient has no known allergies.  History reviewed. No pertinent family history.  Social History Social History   Tobacco Use  . Smoking status: Never Smoker  . Smokeless tobacco: Never Used  Substance Use Topics  . Alcohol use: No  . Drug use: No    Review of Systems  Constitutional: No fever/chills Eyes: No visual changes. ENT: No sore throat. Cardiovascular: Denies chest pain. Respiratory: Denies shortness of breath. Gastrointestinal: No abdominal pain.  No nausea, no vomiting.  No  diarrhea.  No constipation. Genitourinary: Negative for dysuria. Musculoskeletal: Negative for back pain. Skin: Negative for rash. Neurological: Negative for focal weakness or numbness.   ____________________________________________   PHYSICAL EXAM:  VITAL SIGNS: ED Triage Vitals  Enc Vitals Group     BP 10/02/18 0430 (!) 153/96     Pulse Rate 10/02/18 0430 88     Resp 10/02/18 0430 18     Temp 10/02/18 0430 97.9 F (36.6 C)     Temp Source 10/02/18 0430 Oral     SpO2 10/02/18 0430 96 %     Weight 10/02/18 0427 135 lb (61.2 kg)     Height 10/02/18 0427 5\' 4"  (1.626 m)     Head Circumference --      Peak Flow --      Pain Score 10/02/18 0427 0     Pain Loc --      Pain Edu? --      Excl. in GC? --     Constitutional: Alert and oriented.  in no acute distress. Eyes: Conjunctivae are normal.  Head: Large frontal hematoma as well as left periorbital ecchymosis.  No depression or bogginess.  Mild tenderness to palpation. Nose: No congestion/rhinnorhea.  No nasal septal hematoma.  Minimal tenderness over the nasal bridge. Mouth/Throat: Mucous membranes are moist.  Neck: No stridor.   Cardiovascular: Normal rate, regular rhythm. Grossly normal heart sounds. Respiratory: Normal respiratory effort.  No retractions. Lungs CTAB. Gastrointestinal: Soft and nontender. No distention. No CVA tenderness. Musculoskeletal: No lower extremity tenderness nor edema.  No joint effusions.  5 out of 5 strength in bilateral lower 70s.  No tenderness to bilateral hips.  No limb shortening.  No tenderness to the chest wall.  No thoracic or lumbar tenderness to palpation.  No tenderness to palpation of midline cervical spine.  No deformity or step-off. Neurologic:  Normal speech and language. No gross focal neurologic deficits are appreciated. Skin:  Skin is warm, dry and intact. No rash noted. Psychiatric: Mood and affect are normal. Speech and behavior are  normal.  ____________________________________________   LABS (all labs ordered are listed, but only abnormal results are displayed)  Labs Reviewed  CBC WITH DIFFERENTIAL/PLATELET - Abnormal; Notable for the following components:      Result Value   WBC 11.2 (*)    Neutro Abs 8.0 (*)    All other components within normal limits  COMPREHENSIVE METABOLIC PANEL - Abnormal; Notable for the following components:   Glucose, Bld 148 (*)    BUN 25 (*)    Creatinine, Ser 1.22 (*)    GFR calc non Af Amer 39 (*)    GFR calc Af Amer 45 (*)    All other components within normal limits  URINALYSIS, COMPLETE (UACMP) WITH MICROSCOPIC - Abnormal; Notable for the following components:   Color, Urine YELLOW (*)    APPearance CLOUDY (*)    Bacteria, UA MANY (*)    All other components within normal limits  TROPONIN I  ____________________________________________  EKG  ED ECG REPORT I, Arelia Longest, the attending physician, personally viewed and interpreted this ECG.   Date: 10/02/2018  EKG Time: 0429  Rate: 86  Rhythm: normal sinus rhythm  Axis: normal  Intervals:none  ST&T Change: no st segment elevation or depression.  t wave inversions in v5-6 without significant change from previous.    ____________________________________________  RADIOLOGY  Large left frontal scalp contusion.  Otherwise without acute issue.  ____________________________________________   PROCEDURES  Procedure(s) performed:   Procedures  Critical Care performed:   ____________________________________________   INITIAL IMPRESSION / ASSESSMENT AND PLAN / ED COURSE  Pertinent labs & imaging results that were available during my care of the patient were reviewed by me and considered in my medical decision making (see chart for details).  DDX: UTI, less light abnormality, syncope, mechanical fall, intrarenal hemorrhage, contusion, ecchymosis, skull fracture, cervical spine fracture, facial bone  fracture As part of my medical decision making, I reviewed the following data within the electronic MEDICAL RECORD NUMBER Notes from prior ED visits  ----------------------------------------- 7:21 AM on 10/02/2018 -----------------------------------------  Patient ambulatory without assistance in the emergency department.  Possible UTI.  Will treat with Keflex.  Otherwise, there is no obvious internal injury on the imaging.  Reassuring exam except for the noted findings above the neck.  Patient understanding of the diagnosis and treatment and willing to comply.  Will be discharged home. ____________________________________________   FINAL CLINICAL IMPRESSION(S) / ED DIAGNOSES  Head injury.  Fall.  UTI.  NEW MEDICATIONS STARTED DURING THIS VISIT:  New Prescriptions   No medications on file     Note:  This document was prepared using Dragon voice recognition software and may include unintentional dictation errors.     Myrna Blazer, MD 10/02/18 [phone]

## 2018-10-02 NOTE — ED Triage Notes (Addendum)
Pt arrived via [facility] from [facility] with c/o fall with head injury. EMS states pt had a unwitnessed mechanical fall. EMS states that pt might have lost her balance. Pt on Eliquis.

## 2018-10-02 NOTE — ED Notes (Signed)
Spoke with Joneen Roach patients daughter about results at [facility] and d/c back to [facility]. Patients daughter reports she will not be coming to take patient back to [facility] and wants her to go back to [facility] the way she arrived. EMS will be contacted for transport

## 2018-10-02 NOTE — ED Notes (Signed)
Pt walking through nursing station in gown.  Brought back to room. Sitter placed in room.

## 2018-10-02 NOTE — ED Notes (Signed)
Patient transported to CT 

## 2018-10-03 LAB — URINE CULTURE

## 2020-10-10 ENCOUNTER — Other Ambulatory Visit: Payer: Self-pay

## 2020-10-10 ENCOUNTER — Encounter: Payer: Self-pay | Admitting: Emergency Medicine

## 2020-10-10 ENCOUNTER — Emergency Department
Admission: EM | Admit: 2020-10-10 | Discharge: 2020-10-11 | Disposition: A | Discharge status: Home / Self Care | Payer: Medicare Other | Attending: Emergency Medicine | Admitting: Emergency Medicine

## 2020-10-10 ENCOUNTER — Emergency Department: Payer: Medicare Other

## 2020-10-10 DIAGNOSIS — Z79899 Other long term (current) drug therapy: Secondary | ICD-10-CM | POA: Diagnosis not present

## 2020-10-10 DIAGNOSIS — F0390 Unspecified dementia without behavioral disturbance: Secondary | ICD-10-CM | POA: Diagnosis not present

## 2020-10-10 DIAGNOSIS — I615 Nontraumatic intracerebral hemorrhage, intraventricular: Secondary | ICD-10-CM

## 2020-10-10 DIAGNOSIS — I129 Hypertensive chronic kidney disease with stage 1 through stage 4 chronic kidney disease, or unspecified chronic kidney disease: Secondary | ICD-10-CM | POA: Insufficient documentation

## 2020-10-10 DIAGNOSIS — N183 Chronic kidney disease, stage 3 unspecified: Secondary | ICD-10-CM | POA: Diagnosis not present

## 2020-10-10 DIAGNOSIS — Z7901 Long term (current) use of anticoagulants: Secondary | ICD-10-CM | POA: Diagnosis not present

## 2020-10-10 DIAGNOSIS — W010XXA Fall on same level from slipping, tripping and stumbling without subsequent striking against object, initial encounter: Secondary | ICD-10-CM | POA: Diagnosis not present

## 2020-10-10 DIAGNOSIS — E119 Type 2 diabetes mellitus without complications: Secondary | ICD-10-CM | POA: Insufficient documentation

## 2020-10-10 DIAGNOSIS — S0990XA Unspecified injury of head, initial encounter: Secondary | ICD-10-CM | POA: Insufficient documentation

## 2020-10-10 DIAGNOSIS — Z20822 Contact with and (suspected) exposure to covid-19: Secondary | ICD-10-CM | POA: Insufficient documentation

## 2020-10-10 DIAGNOSIS — S06360A Traumatic hemorrhage of cerebrum, unspecified, without loss of consciousness, initial encounter: Secondary | ICD-10-CM | POA: Insufficient documentation

## 2020-10-10 LAB — CBC WITH DIFFERENTIAL/PLATELET
Abs Immature Granulocytes: 0.05 10*3/uL (ref 0.00–0.07)
Basophils Absolute: 0 10*3/uL (ref 0.0–0.1)
Basophils Relative: 0 %
Eosinophils Absolute: 0.1 10*3/uL (ref 0.0–0.5)
Eosinophils Relative: 1 %
HCT: 35 % — ABNORMAL LOW (ref 36.0–46.0)
Hemoglobin: 11.3 g/dL — ABNORMAL LOW (ref 12.0–15.0)
Immature Granulocytes: 0 %
Lymphocytes Relative: 28 %
Lymphs Abs: 3.5 10*3/uL (ref 0.7–4.0)
MCH: 28.9 pg (ref 26.0–34.0)
MCHC: 32.3 g/dL (ref 30.0–36.0)
MCV: 89.5 fL (ref 80.0–100.0)
Monocytes Absolute: 1.2 10*3/uL — ABNORMAL HIGH (ref 0.1–1.0)
Monocytes Relative: 10 %
Neutro Abs: 7.4 10*3/uL (ref 1.7–7.7)
Neutrophils Relative %: 61 %
Platelets: 287 10*3/uL (ref 150–400)
RBC: 3.91 MIL/uL (ref 3.87–5.11)
RDW: 14.9 % (ref 11.5–15.5)
WBC: 12.3 10*3/uL — ABNORMAL HIGH (ref 4.0–10.5)
nRBC: 0 % (ref 0.0–0.2)

## 2020-10-10 LAB — BASIC METABOLIC PANEL
Anion gap: 12 (ref 5–15)
BUN: 30 mg/dL — ABNORMAL HIGH (ref 8–23)
CO2: 24 mmol/L (ref 22–32)
Calcium: 9.1 mg/dL (ref 8.9–10.3)
Chloride: 111 mmol/L (ref 98–111)
Creatinine, Ser: 1.29 mg/dL — ABNORMAL HIGH (ref 0.44–1.00)
GFR, Estimated: 37 mL/min — ABNORMAL LOW (ref >60)
Glucose, Bld: 108 mg/dL — ABNORMAL HIGH (ref 70–99)
Potassium: 4.1 mmol/L (ref 3.5–5.1)
Sodium: 147 mmol/L — ABNORMAL HIGH (ref 135–145)

## 2020-10-10 LAB — APTT: aPTT: 33 seconds (ref 24–36)

## 2020-10-10 LAB — PROTIME-INR
INR: 1.3 — ABNORMAL HIGH (ref 0.8–1.2)
Prothrombin Time: 15.2 seconds (ref 11.4–15.2)

## 2020-10-10 LAB — RESPIRATORY PANEL BY RT PCR (FLU A&B, COVID)
Influenza A by PCR: NEGATIVE
Influenza B by PCR: NEGATIVE
SARS Coronavirus 2 by RT PCR: NEGATIVE

## 2020-10-10 MED ORDER — LACTATED RINGERS IV SOLN: INTRAVENOUS | Status: DC

## 2020-10-10 MED ORDER — PROTHROMBIN COMPLEX CONC HUMAN 1000 UNITS IV KIT
3255.0000 [IU] | PACK | Status: DC
  Filled 2020-10-10: qty 3255

## 2020-10-10 NOTE — ED Notes (Signed)
Pt presentation discussed with EDP, Goodman; see new orders. 

## 2020-10-10 NOTE — ED Notes (Signed)
Brief changed / Purwick placed

## 2020-10-10 NOTE — ED Notes (Signed)
MD. Geraldo Pitter spoke with daughter

## 2020-10-10 NOTE — ED Notes (Signed)
Daughter   Piedad Climes [phone]

## 2020-10-10 NOTE — ED Provider Notes (Signed)
[facility] Emergency Department Provider Note   ____________________________________________   I have reviewed the triage vital signs and the nursing notes.   HISTORY  Chief Complaint Fall   History limited by and level 5 caveat due to: Dementia   HPI Theresa Santos is a 86 y.o. female who presents to the emergency department today from living facility because of an unwitnessed fall and concern for head trauma. The patient cannot give good history but states that she tripped on something. The patient denies any headache. Denies any vision change. No extremity illness.    Records reviewed. Per medical record review patient has a history of HTN, TIA. On eliquis.   Past Medical History:  Diagnosis Date   Chronic kidney disease    Diabetes mellitus without complication (HCC)    High cholesterol    Hypertension    TIA (transient ischemic attack)    Vascular dementia (HCC)    Vitamin B12 deficiency     Patient Active Problem List   Diagnosis Date Noted   Syncope 05/08/2017   HTN, goal below 140/80 05/08/2017   Knee effusion, left 11/14/2016   Hemarthrosis 11/14/2016   Cardiomyopathy (HCC) 11/14/2016   Acute pulmonary embolism (HCC) 11/13/2016   Metabolic acidosis 11/13/2016   CKD (chronic kidney disease), stage III (HCC) 11/13/2016   Hypokalemia 11/13/2016   Elevated troponin 11/13/2016   Leukocytosis 11/13/2016   Thrombocytopenia (HCC) 11/13/2016   Right leg pain 11/13/2016   Fall 11/13/2016   Acute respiratory failure with hypoxia (HCC) 11/11/2016    Past Surgical History:  Procedure Laterality Date   Left knee surgery     > 20 years ago    Prior to Admission medications   Medication Sig Start Date End Date Taking? Authorizing Provider  apixaban (ELIQUIS) 5 MG TABS tablet Take 1 tablet (5 mg total) by mouth 2 (two) times daily. 11/20/16   Katharina Caper, MD  atorvastatin (LIPITOR) 10 MG tablet Take 10 mg by  mouth daily.    [provider]  carvedilol (COREG) 3.125 MG tablet Take 1 tablet (3.125 mg total) by mouth 2 (two) times daily with a meal. 11/14/16   Katharina Caper, MD  dorzolamide-timolol (COSOPT) 22.3-6.8 MG/ML ophthalmic solution Place 1 drop into both eyes 2 (two) times daily.    [provider]  latanoprost (XALATAN) 0.005 % ophthalmic solution Place 1 drop into both eyes at bedtime.    [provider]  lisinopril (PRINIVIL,ZESTRIL) 5 MG tablet Take 5 mg by mouth daily.    [provider]  LORazepam (ATIVAN) 0.5 MG tablet Take 1 tablet (0.5 mg total) by mouth daily as needed for anxiety (agitation/sundowning). Patient taking differently: Take 0.5 mg by mouth every evening. As needed for anxiety or agitation 11/13/16   Katharina Caper, MD  Multiple Vitamin (THEREMS) TABS Take 1 tablet by mouth daily.    [provider]  trolamine salicylate (ASPERCREME) 10 % cream Apply topically 2 (two) times daily. Patient taking differently: Apply 1 application topically 2 (two) times daily as needed. To knees 11/14/16   Katharina Caper, MD  vitamin B-12 (CYANOCOBALAMIN) 1000 MCG tablet Take 1,000 mcg by mouth daily.    [provider]    Allergies Patient has no known allergies.  No family history on file.  Social History Social History   Tobacco Use   Smoking status: Never Smoker   Smokeless tobacco: Never Used  Substance Use Topics   Alcohol use: No   Drug use: No  Review of Systems Unable to obtain reliable ROS secondary to dementia. ____________________________________________   PHYSICAL EXAM:  VITAL SIGNS: ED Triage Vitals  Enc Vitals Group     BP 10/10/20 1633 (!) 158/77     Pulse Rate 10/10/20 1633 84     Resp 10/10/20 1633 16     Temp 10/10/20 1633 98.7 F (37.1 C)     Temp Source 10/10/20 1633 Oral     SpO2 10/10/20 1633 92 %     Weight 10/10/20 1634 143 lb 4.8 oz (65 kg)     Height 10/10/20 1634 5\' 4"  (1.626  m)   Constitutional: Awake and alert. Not completely oriented.  Eyes: Conjunctivae are normal.  ENT      Head: Normocephalic. Hematoma to left forehead.       Nose: No congestion/rhinnorhea.      Mouth/Throat: Mucous membranes are moist.      Neck: No stridor. Hematological/Lymphatic/Immunilogical: No cervical lymphadenopathy. Cardiovascular: Normal rate, regular rhythm.  No murmurs, rubs, or gallops.  Respiratory: Normal respiratory effort without tachypnea nor retractions. Breath sounds are clear and equal bilaterally. No wheezes/rales/rhonchi. Gastrointestinal: Soft and non tender. No rebound. No guarding.  Genitourinary: Deferred Musculoskeletal: Normal range of motion in all extremities. No lower extremity edema. Neurologic:  Normal speech and language. No gross focal neurologic deficits are appreciated.  Skin:  Skin is warm, dry and intact. No rash noted. Psychiatric: Mood and affect are normal. Speech and behavior are normal. Patient exhibits appropriate insight and judgment.  ____________________________________________    LABS (pertinent positives/negatives)  BMP na 147, k 4.1, glu 108, cr 1.29 CBC wbc 12.3, hgb 11.3, plt 287  ____________________________________________   EKG  I, , attending physician, personally viewed and interpreted this EKG  EKG Time: 1632 Rate: 84 Rhythm: normal sinus rhythm Axis: normal Intervals: qtc 441 QRS: narrow, q waves III, aVF ST changes: no st elevation Impression: abnormal ekg  ____________________________________________    RADIOLOGY  CT head/cervical spine Small intraventricular bleed in anterior third ventrical  ____________________________________________   PROCEDURES  Procedures  ____________________________________________   INITIAL IMPRESSION / ASSESSMENT AND PLAN / ED COURSE  Pertinent labs & imaging results that were available during my care of the patient were reviewed by me and  considered in my medical decision making (see chart for details).   Patient presented to the emergency department today because of concerns for unwitnessed fall.  Patient has a history of dementia is coming from the living facility.  On exam she does have a hematoma to her left forehead.  CT scan did show a small intraventricular hemorrhage.  Discussed with Dr. 1633 with neurosurgery.  At this time he recommended observation in the ED and repeat CT scan in 6 hours.  If no worsening he felt the patient could be discharged back to living facility.  He would recommend stopping Eliquis for 1 week.  He did not think that the Eliquis needed to be reversed at this time. Discussed finding and plan with patient's daughter.    ____________________________________________   FINAL CLINICAL IMPRESSION(S) / ED DIAGNOSES  Final diagnoses:  Intraventricular hemorrhage (HCC)  Injury of head, initial encounter     Note: This dictation was prepared with Dragon dictation. Any transcriptional errors that result from this process are unintentional     Myer Haff, MD 10/10/20 2230

## 2020-10-10 NOTE — ED Triage Notes (Signed)
Pt in via [facility] from [facility].  Per EMS, pt with unwitnessed fall; pt unable to recall nature of the fall.  Pt presents with large hematoma to left forehead, is on Eliquis per EMS report.    Dementia at baseline.  Pt A/O to self only.

## 2020-10-10 NOTE — Discharge Instructions (Addendum)
Please hold Theresa Santos's Eliquis for at least 1 week. There will then need to be a discussion between Theresa Santos's family and primary care physician about restarting the Eliquis. Please have Theresa Santos be seen for any concerning change in behavior, persistent vomiting, severe headache or any other new or concerning symptoms.

## 2020-10-10 NOTE — ED Triage Notes (Signed)
Pt comes via EMS from [facility] with unwitnessed fall. Pt found on floor side of bed. Pt on blood thinners. Pt has dementia. Baseline per facility

## 2020-10-10 NOTE — ED Provider Notes (Signed)
-----------------------------------------   11:15 PM on 10/10/2020 -----------------------------------------  Blood pressure (!) 150/78, pulse 78, temperature 98.7 F (37.1 C), temperature source Oral, resp. rate (!) 21, height 5\' 4"  (1.626 m), weight 65 kg, SpO2 97 %.  Assuming care from Dr. .  In short, Theresa Santos is a 86 y.o. female with a chief complaint of Fall .  Refer to the original H&P for additional details.  The current plan of care is to follow-up repeat head CT, if stable then patient may be discharged home with plan to hold Eliquis for the next few days.  ----------------------------------------- 12:17 AM on 10/11/2020 -----------------------------------------  Repeat head CT is stable with no progression of her small intraventricular hemorrhage.  On reassessment, patient is sleeping comfortably, arouses easily and denies any complaints.  She is oriented to person and place, but not time and does not appear to have any focal neurologic deficits.  She is appropriate for discharge back to nursing facility and I spoke with her daughter over the phone to let her know that she should stop her Eliquis for at least 1 week.  She will then need to have further discussion with her PCP about restarting the Eliquis.  Patient's daughter agrees with plan.    10/13/2020, MD 10/11/20 [phone]

## 2020-10-11 NOTE — ED Notes (Addendum)
Attempted to call [facility] two times. Placed on hold for over 10 minutes each time. Unable to give report. Dr Larinda Buttery spoke with daughter and passed on info about stopping Eliquis and follow up evaluations.

## 2020-10-14 ENCOUNTER — Other Ambulatory Visit: Payer: Self-pay | Admitting: Neurosurgery

## 2020-10-14 DIAGNOSIS — I615 Nontraumatic intracerebral hemorrhage, intraventricular: Secondary | ICD-10-CM

## 2020-11-11 ENCOUNTER — Ambulatory Visit
Admission: RE | Admit: 2020-11-11 | Discharge: 2020-11-11 | Disposition: A | Discharge status: Home / Self Care | Payer: Medicare Other | Source: Ambulatory Visit | Attending: Neurosurgery | Admitting: Neurosurgery

## 2020-11-11 ENCOUNTER — Other Ambulatory Visit: Payer: Self-pay

## 2020-11-11 DIAGNOSIS — I615 Nontraumatic intracerebral hemorrhage, intraventricular: Secondary | ICD-10-CM

## 2020-11-13 ENCOUNTER — Ambulatory Visit: Admission: RE | Admit: 2020-11-13 | Payer: Medicare Other | Source: Ambulatory Visit

## 2020-11-14 ENCOUNTER — Other Ambulatory Visit: Payer: Self-pay

## 2020-11-14 ENCOUNTER — Ambulatory Visit
Admission: RE | Admit: 2020-11-14 | Discharge: 2020-11-14 | Disposition: A | Discharge status: Home / Self Care | Payer: Medicare Other | Source: Ambulatory Visit | Attending: Neurosurgery | Admitting: Neurosurgery

## 2020-11-14 DIAGNOSIS — I615 Nontraumatic intracerebral hemorrhage, intraventricular: Secondary | ICD-10-CM | POA: Insufficient documentation
